# Patient Record
Sex: Female | Born: 1985 | Hispanic: No | Marital: Married | Smoking: Never smoker
Health system: Southern US, Community
[De-identification: ages and names within clinical notes are randomized; demographics above are authoritative.]

## PROBLEM LIST (undated history)

## (undated) ENCOUNTER — Inpatient Hospital Stay (HOSPITAL_COMMUNITY): Payer: Self-pay

## (undated) DIAGNOSIS — G971 Other reaction to spinal and lumbar puncture: Secondary | ICD-10-CM

## (undated) DIAGNOSIS — O24419 Gestational diabetes mellitus in pregnancy, unspecified control: Secondary | ICD-10-CM

## (undated) HISTORY — PX: WISDOM TOOTH EXTRACTION: SHX21

## (undated) HISTORY — DX: Other reaction to spinal and lumbar puncture: G97.1

## (undated) HISTORY — PX: CERVICAL CERCLAGE: SHX1329

## (undated) HISTORY — DX: Gestational diabetes mellitus in pregnancy, unspecified control: O24.419

---

## 2014-05-12 LAB — OB RESULTS CONSOLE GC/CHLAMYDIA
Chlamydia: NEGATIVE
Gonorrhea: NEGATIVE

## 2014-05-12 LAB — OB RESULTS CONSOLE RPR: RPR: NONREACTIVE

## 2014-05-12 LAB — OB RESULTS CONSOLE HIV ANTIBODY (ROUTINE TESTING): HIV: NONREACTIVE

## 2014-05-12 LAB — OB RESULTS CONSOLE HEPATITIS B SURFACE ANTIGEN: Hepatitis B Surface Ag: NEGATIVE

## 2014-05-20 ENCOUNTER — Encounter: Payer: PPO | Attending: Obstetrics

## 2014-05-20 DIAGNOSIS — O24419 Gestational diabetes mellitus in pregnancy, unspecified control: Secondary | ICD-10-CM | POA: Insufficient documentation

## 2014-05-20 DIAGNOSIS — Z713 Dietary counseling and surveillance: Secondary | ICD-10-CM | POA: Insufficient documentation

## 2014-05-21 ENCOUNTER — Ambulatory Visit: Payer: Self-pay | Admitting: *Deleted

## 2014-05-21 ENCOUNTER — Encounter: Payer: Self-pay | Admitting: *Deleted

## 2014-05-21 ENCOUNTER — Encounter: Payer: PPO | Admitting: *Deleted

## 2014-05-21 VITALS — Ht 60.0 in | Wt 150.4 lb

## 2014-05-21 DIAGNOSIS — O24419 Gestational diabetes mellitus in pregnancy, unspecified control: Secondary | ICD-10-CM

## 2014-05-21 DIAGNOSIS — Z713 Dietary counseling and surveillance: Secondary | ICD-10-CM | POA: Diagnosis not present

## 2014-05-24 ENCOUNTER — Encounter: Payer: Self-pay | Admitting: *Deleted

## 2014-05-24 NOTE — Progress Notes (Signed)
  Patient was seen on 05/21/14 for Gestational Diabetes self-management . The following learning objectives were met by the patient :   States the definition of Gestational Diabetes  States why dietary management is important in controlling blood glucose  Describes the effects of carbohydrates on blood glucose levels  Demonstrates ability to create a balanced meal plan  Demonstrates carbohydrate counting   States when to check blood glucose levels  Demonstrates proper blood glucose monitoring techniques  States the effect of stress and exercise on blood glucose levels  States the importance of limiting caffeine and abstaining from alcohol and smoking  Plan:  Aim for 2 Carb Choices per meal (30 grams) +/- 1 either way for breakfast Aim for 3 Carb Choices per meal (45 grams) +/- 1 either way from lunch and dinner Aim for 1-2 Carbs per snack Begin reading food labels for Total Carbohydrate and sugar grams of foods Consider  increasing your activity level by walking daily as tolerated Begin checking BG before breakfast and 2 hours after first bit of breakfast, lunch and dinner after  as directed by MD  Take medication  as directed by MD  Blood glucose monitor given: Accu Chek Nano BG Monitoring Kit Lot # [id number] Exp: 04/07/15  Patient instructed to monitor glucose levels: FBS: 60 - <90 2 hour: <120  Patient received the following handouts:  Nutrition Diabetes and Pregnancy  Carbohydrate Counting List  Meal Planning worksheet  Patient will be seen for follow-up as needed.

## 2014-06-05 LAB — OB RESULTS CONSOLE GBS: STREP GROUP B AG: NEGATIVE

## 2014-06-28 ENCOUNTER — Encounter (HOSPITAL_COMMUNITY): Payer: Self-pay

## 2014-06-28 ENCOUNTER — Inpatient Hospital Stay (HOSPITAL_COMMUNITY)
Admission: AD | Admit: 2014-06-28 | Discharge: 2014-07-02 | DRG: 765 | Disposition: A | Discharge status: Home / Self Care | Payer: PPO | Source: Ambulatory Visit | Attending: Obstetrics & Gynecology | Admitting: Obstetrics & Gynecology

## 2014-06-28 DIAGNOSIS — Z3A40 40 weeks gestation of pregnancy: Secondary | ICD-10-CM | POA: Diagnosis present

## 2014-06-28 DIAGNOSIS — O4593 Premature separation of placenta, unspecified, third trimester: Secondary | ICD-10-CM | POA: Diagnosis present

## 2014-06-28 DIAGNOSIS — O480 Post-term pregnancy: Secondary | ICD-10-CM | POA: Diagnosis present

## 2014-06-28 DIAGNOSIS — O24429 Gestational diabetes mellitus in childbirth, unspecified control: Secondary | ICD-10-CM | POA: Diagnosis present

## 2014-06-28 DIAGNOSIS — O99824 Streptococcus B carrier state complicating childbirth: Secondary | ICD-10-CM | POA: Diagnosis present

## 2014-06-28 DIAGNOSIS — O4590 Premature separation of placenta, unspecified, unspecified trimester: Secondary | ICD-10-CM | POA: Diagnosis not present

## 2014-06-28 LAB — TYPE AND SCREEN
ABO/RH(D): O POS
Antibody Screen: NEGATIVE

## 2014-06-28 LAB — CBC
HCT: 37.1 % (ref 36.0–46.0)
Hemoglobin: 12.2 g/dL (ref 12.0–15.0)
MCH: 27.4 pg (ref 26.0–34.0)
MCHC: 32.9 g/dL (ref 30.0–36.0)
MCV: 83.2 fL (ref 78.0–100.0)
Platelets: 244 10*3/uL (ref 150–400)
RBC: 4.46 MIL/uL (ref 3.87–5.11)
RDW: 16.5 % — ABNORMAL HIGH (ref 11.5–15.5)
WBC: 6.9 10*3/uL (ref 4.0–10.5)

## 2014-06-28 MED ORDER — CITRIC ACID-SODIUM CITRATE 334-500 MG/5ML PO SOLN
30.0000 mL | ORAL | Status: DC | PRN
  Administered 2014-06-29: 30 mL via ORAL
  Filled 2014-06-28 (×2): qty 15

## 2014-06-28 MED ORDER — LACTATED RINGERS IV SOLN
500.0000 mL | INTRAVENOUS | Status: DC | PRN
  Administered 2014-06-29: 500 mL via INTRAVENOUS

## 2014-06-28 MED ORDER — FLEET ENEMA 7-19 GM/118ML RE ENEM: 1.0000 | ENEMA | RECTAL | Status: DC | PRN

## 2014-06-28 MED ORDER — OXYCODONE-ACETAMINOPHEN 5-325 MG PO TABS: 2.0000 | ORAL_TABLET | ORAL | Status: DC | PRN

## 2014-06-28 MED ORDER — OXYTOCIN 40 UNITS IN LACTATED RINGERS INFUSION - SIMPLE MED: 62.5000 mL/h | INTRAVENOUS | Status: DC

## 2014-06-28 MED ORDER — OXYTOCIN BOLUS FROM INFUSION: 500.0000 mL | INTRAVENOUS | Status: DC

## 2014-06-28 MED ORDER — LACTATED RINGERS IV SOLN
INTRAVENOUS | Status: DC
  Administered 2014-06-28 – 2014-06-29 (×2): via INTRAVENOUS

## 2014-06-28 MED ORDER — ACETAMINOPHEN 325 MG PO TABS: 650.0000 mg | ORAL_TABLET | ORAL | Status: DC | PRN

## 2014-06-28 MED ORDER — OXYCODONE-ACETAMINOPHEN 5-325 MG PO TABS: 1.0000 | ORAL_TABLET | ORAL | Status: DC | PRN

## 2014-06-28 MED ORDER — ONDANSETRON HCL 4 MG/2ML IJ SOLN: 4.0000 mg | Freq: Four times a day (QID) | INTRAMUSCULAR | Status: DC | PRN

## 2014-06-28 MED ORDER — LIDOCAINE HCL (PF) 1 % IJ SOLN: 30.0000 mL | INTRAMUSCULAR | Status: DC | PRN

## 2014-06-28 NOTE — MAU Note (Signed)
Pt states that she started to leak clear fluid at 1800. Pt states that baby is active.

## 2014-06-28 NOTE — MAU Note (Signed)
Pt to be admitted with labor orders as per protocol.

## 2014-06-28 NOTE — MAU Note (Signed)
PT presents complaining of a gush of clear fluid at 1800 with some bloody show. Feels like it's still leaking but not wearing pad. States she is also having contractions. Reports good fetal movement.

## 2014-06-28 NOTE — MAU Note (Signed)
Report called to [facility] RN on BS. Will go to 170

## 2014-06-29 ENCOUNTER — Encounter (HOSPITAL_COMMUNITY): Payer: Self-pay | Admitting: *Deleted

## 2014-06-29 ENCOUNTER — Inpatient Hospital Stay (HOSPITAL_COMMUNITY): Payer: PPO | Admitting: Anesthesiology

## 2014-06-29 ENCOUNTER — Encounter (HOSPITAL_COMMUNITY)
Admission: AD | Disposition: A | Discharge status: Home / Self Care | Payer: Self-pay | Source: Ambulatory Visit | Attending: Obstetrics & Gynecology

## 2014-06-29 DIAGNOSIS — O4590 Premature separation of placenta, unspecified, unspecified trimester: Secondary | ICD-10-CM | POA: Diagnosis not present

## 2014-06-29 LAB — CBC
HEMATOCRIT: 31.8 % — AB (ref 36.0–46.0)
Hemoglobin: 10.4 g/dL — ABNORMAL LOW (ref 12.0–15.0)
MCH: 27.7 pg (ref 26.0–34.0)
MCHC: 32.7 g/dL (ref 30.0–36.0)
MCV: 84.8 fL (ref 78.0–100.0)
Platelets: 206 10*3/uL (ref 150–400)
RBC: 3.75 MIL/uL — AB (ref 3.87–5.11)
RDW: 16.6 % — ABNORMAL HIGH (ref 11.5–15.5)
WBC: 15.9 10*3/uL — ABNORMAL HIGH (ref 4.0–10.5)

## 2014-06-29 LAB — SAVE SMEAR

## 2014-06-29 LAB — GLUCOSE, CAPILLARY
GLUCOSE-CAPILLARY: 110 mg/dL — AB (ref 70–99)
GLUCOSE-CAPILLARY: 92 mg/dL (ref 70–99)
Glucose-Capillary: 85 mg/dL (ref 70–99)

## 2014-06-29 LAB — FIBRINOGEN
FIBRINOGEN: 548 mg/dL — AB (ref 204–475)
Fibrinogen: 468 mg/dL (ref 204–475)

## 2014-06-29 LAB — PROTIME-INR
INR: 0.96 (ref 0.00–1.49)
Prothrombin Time: 12.9 seconds (ref 11.6–15.2)

## 2014-06-29 LAB — APTT: APTT: 27 s (ref 24–37)

## 2014-06-29 LAB — ABO/RH: ABO/RH(D): O POS

## 2014-06-29 LAB — RPR: RPR Ser Ql: NONREACTIVE

## 2014-06-29 LAB — PLATELET COUNT: PLATELETS: 236 10*3/uL (ref 150–400)

## 2014-06-29 SURGERY — Surgical Case
Anesthesia: Epidural

## 2014-06-29 MED ORDER — LACTATED RINGERS IV SOLN
INTRAVENOUS | Status: DC | PRN
  Administered 2014-06-29: 11:00:00 via INTRAVENOUS

## 2014-06-29 MED ORDER — OXYTOCIN 40 UNITS IN LACTATED RINGERS INFUSION - SIMPLE MED: 62.5000 mL/h | INTRAVENOUS | Status: AC

## 2014-06-29 MED ORDER — MISOPROSTOL 25 MCG QUARTER TABLET: 25.0000 ug | ORAL_TABLET | ORAL | Status: DC | PRN

## 2014-06-29 MED ORDER — DIPHENHYDRAMINE HCL 25 MG PO CAPS
25.0000 mg | ORAL_CAPSULE | ORAL | Status: DC | PRN
  Administered 2014-06-30: 25 mg via ORAL
  Filled 2014-06-29: qty 1

## 2014-06-29 MED ORDER — FENTANYL 2.5 MCG/ML BUPIVACAINE 1/10 % EPIDURAL INFUSION (WH - ANES)
INTRAMUSCULAR | Status: DC | PRN
  Administered 2014-06-29: 14 mL/h via EPIDURAL

## 2014-06-29 MED ORDER — NALBUPHINE HCL 10 MG/ML IJ SOLN
5.0000 mg | INTRAMUSCULAR | Status: DC | PRN
  Administered 2014-06-29: 5 mg via INTRAVENOUS

## 2014-06-29 MED ORDER — SCOPOLAMINE 1 MG/3DAYS TD PT72
1.0000 | MEDICATED_PATCH | Freq: Once | TRANSDERMAL | Status: AC
  Administered 2014-06-29: 1.5 mg via TRANSDERMAL
  Filled 2014-06-29: qty 1

## 2014-06-29 MED ORDER — EPHEDRINE 5 MG/ML INJ
10.0000 mg | INTRAVENOUS | Status: DC | PRN
Start: 2014-06-29 — End: 2014-06-29

## 2014-06-29 MED ORDER — TETANUS-DIPHTH-ACELL PERTUSSIS 5-2.5-18.5 LF-MCG/0.5 IM SUSP: 0.5000 mL | Freq: Once | INTRAMUSCULAR | Status: DC

## 2014-06-29 MED ORDER — NALBUPHINE HCL 10 MG/ML IJ SOLN
5.0000 mg | Freq: Once | INTRAMUSCULAR | Status: AC | PRN
  Administered 2014-06-29: 5 mg via SUBCUTANEOUS

## 2014-06-29 MED ORDER — BUTORPHANOL TARTRATE 1 MG/ML IJ SOLN: 1.0000 mg | INTRAMUSCULAR | Status: DC | PRN

## 2014-06-29 MED ORDER — ZOLPIDEM TARTRATE 5 MG PO TABS: 5.0000 mg | ORAL_TABLET | Freq: Every evening | ORAL | Status: DC | PRN

## 2014-06-29 MED ORDER — SIMETHICONE 80 MG PO CHEW: 80.0000 mg | CHEWABLE_TABLET | ORAL | Status: DC | PRN

## 2014-06-29 MED ORDER — DIPHENHYDRAMINE HCL 25 MG PO CAPS
25.0000 mg | ORAL_CAPSULE | Freq: Four times a day (QID) | ORAL | Status: DC | PRN
  Filled 2014-06-29: qty 1

## 2014-06-29 MED ORDER — ONDANSETRON HCL 4 MG/2ML IJ SOLN
INTRAMUSCULAR | Status: DC | PRN
  Administered 2014-06-29: 4 mg via INTRAVENOUS

## 2014-06-29 MED ORDER — SIMETHICONE 80 MG PO CHEW
80.0000 mg | CHEWABLE_TABLET | Freq: Three times a day (TID) | ORAL | Status: DC
  Administered 2014-06-29 – 2014-07-02 (×8): 80 mg via ORAL
  Filled 2014-06-29 (×7): qty 1

## 2014-06-29 MED ORDER — NALBUPHINE HCL 10 MG/ML IJ SOLN
5.0000 mg | INTRAMUSCULAR | Status: DC | PRN
  Filled 2014-06-29: qty 1

## 2014-06-29 MED ORDER — WITCH HAZEL-GLYCERIN EX PADS: 1.0000 "application " | MEDICATED_PAD | CUTANEOUS | Status: DC | PRN

## 2014-06-29 MED ORDER — SENNOSIDES-DOCUSATE SODIUM 8.6-50 MG PO TABS
2.0000 | ORAL_TABLET | ORAL | Status: DC
  Administered 2014-06-29 – 2014-07-02 (×3): 2 via ORAL
  Filled 2014-06-29 (×3): qty 2

## 2014-06-29 MED ORDER — DIBUCAINE 1 % RE OINT: 1.0000 "application " | TOPICAL_OINTMENT | RECTAL | Status: DC | PRN

## 2014-06-29 MED ORDER — FENTANYL CITRATE 0.05 MG/ML IJ SOLN: 25.0000 ug | INTRAMUSCULAR | Status: DC | PRN

## 2014-06-29 MED ORDER — PHENYLEPHRINE HCL 10 MG/ML IJ SOLN
INTRAMUSCULAR | Status: DC | PRN
  Administered 2014-06-29 (×2): 80 ug via INTRAVENOUS

## 2014-06-29 MED ORDER — SIMETHICONE 80 MG PO CHEW
80.0000 mg | CHEWABLE_TABLET | ORAL | Status: DC
  Administered 2014-06-29 – 2014-07-02 (×3): 80 mg via ORAL
  Filled 2014-06-29 (×3): qty 1

## 2014-06-29 MED ORDER — ONDANSETRON HCL 4 MG/2ML IJ SOLN: 4.0000 mg | Freq: Three times a day (TID) | INTRAMUSCULAR | Status: DC | PRN

## 2014-06-29 MED ORDER — NALOXONE HCL 0.4 MG/ML IJ SOLN: 0.4000 mg | INTRAMUSCULAR | Status: DC | PRN

## 2014-06-29 MED ORDER — NALOXONE HCL 1 MG/ML IJ SOLN
1.0000 ug/kg/h | INTRAVENOUS | Status: DC | PRN
  Filled 2014-06-29: qty 2

## 2014-06-29 MED ORDER — PHENYLEPHRINE 40 MCG/ML (10ML) SYRINGE FOR IV PUSH (FOR BLOOD PRESSURE SUPPORT)
80.0000 ug | PREFILLED_SYRINGE | INTRAVENOUS | Status: DC | PRN
Start: 2014-06-29 — End: 2014-06-29
  Administered 2014-06-29 (×2): 80 ug via INTRAVENOUS

## 2014-06-29 MED ORDER — LACTATED RINGERS IV SOLN: INTRAVENOUS | Status: DC

## 2014-06-29 MED ORDER — OXYTOCIN 10 UNIT/ML IJ SOLN
40.0000 [IU] | INTRAMUSCULAR | Status: DC | PRN
  Administered 2014-06-29: 40 [IU] via INTRAVENOUS

## 2014-06-29 MED ORDER — OXYCODONE-ACETAMINOPHEN 5-325 MG PO TABS
1.0000 | ORAL_TABLET | ORAL | Status: DC | PRN
  Administered 2014-06-30 – 2014-07-02 (×2): 1 via ORAL
  Filled 2014-06-29 (×2): qty 1

## 2014-06-29 MED ORDER — ONDANSETRON HCL 4 MG/2ML IJ SOLN
INTRAMUSCULAR | Status: AC
  Filled 2014-06-29: qty 2

## 2014-06-29 MED ORDER — OXYTOCIN 40 UNITS IN LACTATED RINGERS INFUSION - SIMPLE MED
1.0000 m[IU]/min | INTRAVENOUS | Status: DC
  Administered 2014-06-29 (×2): 2 m[IU]/min via INTRAVENOUS
  Filled 2014-06-29: qty 1000

## 2014-06-29 MED ORDER — TERBUTALINE SULFATE 1 MG/ML IJ SOLN
0.2500 mg | Freq: Once | INTRAMUSCULAR | Status: AC | PRN
  Administered 2014-06-29: 10:00:00 via SUBCUTANEOUS

## 2014-06-29 MED ORDER — LIDOCAINE-EPINEPHRINE (PF) 2 %-1:200000 IJ SOLN
INTRAMUSCULAR | Status: DC | PRN
  Administered 2014-06-29: 5 mL via EPIDURAL
  Administered 2014-06-29: 2 mL via EPIDURAL
  Administered 2014-06-29: 5 mL via EPIDURAL

## 2014-06-29 MED ORDER — LANOLIN HYDROUS EX OINT: 1.0000 "application " | TOPICAL_OINTMENT | CUTANEOUS | Status: DC | PRN

## 2014-06-29 MED ORDER — ONDANSETRON HCL 4 MG/2ML IJ SOLN: 4.0000 mg | Freq: Once | INTRAMUSCULAR | Status: DC | PRN

## 2014-06-29 MED ORDER — CEFAZOLIN SODIUM-DEXTROSE 2-3 GM-% IV SOLR
INTRAVENOUS | Status: DC | PRN
  Administered 2014-06-29: 2 g via INTRAVENOUS

## 2014-06-29 MED ORDER — ACETAMINOPHEN 500 MG PO TABS
1000.0000 mg | ORAL_TABLET | Freq: Four times a day (QID) | ORAL | Status: AC
  Administered 2014-06-29 – 2014-06-30 (×3): 1000 mg via ORAL
  Filled 2014-06-29 (×3): qty 2

## 2014-06-29 MED ORDER — DIPHENHYDRAMINE HCL 50 MG/ML IJ SOLN: 12.5000 mg | INTRAMUSCULAR | Status: DC | PRN

## 2014-06-29 MED ORDER — TERBUTALINE SULFATE 1 MG/ML IJ SOLN
INTRAMUSCULAR | Status: AC
  Filled 2014-06-29: qty 1

## 2014-06-29 MED ORDER — MORPHINE SULFATE (PF) 0.5 MG/ML IJ SOLN
INTRAMUSCULAR | Status: DC | PRN
  Administered 2014-06-29: 1 mg via INTRAVENOUS
  Administered 2014-06-29: 4 mg via EPIDURAL

## 2014-06-29 MED ORDER — PRENATAL MULTIVITAMIN CH
1.0000 | ORAL_TABLET | Freq: Every day | ORAL | Status: DC
  Administered 2014-06-29 – 2014-07-01 (×3): 1 via ORAL
  Filled 2014-06-29 (×3): qty 1

## 2014-06-29 MED ORDER — DIPHENHYDRAMINE HCL 50 MG/ML IJ SOLN
12.5000 mg | INTRAMUSCULAR | Status: DC | PRN
Start: 2014-06-29 — End: 2014-07-02
  Administered 2014-06-29: 12.5 mg via INTRAVENOUS
  Filled 2014-06-29: qty 1

## 2014-06-29 MED ORDER — IBUPROFEN 600 MG PO TABS
600.0000 mg | ORAL_TABLET | Freq: Four times a day (QID) | ORAL | Status: DC
  Administered 2014-06-29 – 2014-07-02 (×11): 600 mg via ORAL
  Filled 2014-06-29 (×11): qty 1

## 2014-06-29 MED ORDER — SODIUM CHLORIDE 0.9 % IJ SOLN: 3.0000 mL | INTRAMUSCULAR | Status: DC | PRN

## 2014-06-29 MED ORDER — LACTATED RINGERS IV SOLN
500.0000 mL | Freq: Once | INTRAVENOUS | Status: AC
  Administered 2014-06-29: 500 mL via INTRAVENOUS

## 2014-06-29 MED ORDER — OXYCODONE-ACETAMINOPHEN 5-325 MG PO TABS
2.0000 | ORAL_TABLET | ORAL | Status: DC | PRN
Start: 2014-06-29 — End: 2014-07-02

## 2014-06-29 MED ORDER — NALBUPHINE HCL 10 MG/ML IJ SOLN
INTRAMUSCULAR | Status: AC
  Filled 2014-06-29: qty 1

## 2014-06-29 MED ORDER — NALBUPHINE HCL 10 MG/ML IJ SOLN: 5.0000 mg | Freq: Once | INTRAMUSCULAR | Status: AC | PRN

## 2014-06-29 MED ORDER — OXYTOCIN 10 UNIT/ML IJ SOLN
INTRAMUSCULAR | Status: AC
  Filled 2014-06-29: qty 4

## 2014-06-29 MED ORDER — PHENYLEPHRINE 40 MCG/ML (10ML) SYRINGE FOR IV PUSH (FOR BLOOD PRESSURE SUPPORT)
80.0000 ug | PREFILLED_SYRINGE | INTRAVENOUS | Status: DC | PRN
  Filled 2014-06-29: qty 20

## 2014-06-29 MED ORDER — OXYTOCIN 40 UNITS IN LACTATED RINGERS INFUSION - SIMPLE MED: 1.0000 m[IU]/min | INTRAVENOUS | Status: DC

## 2014-06-29 MED ORDER — MORPHINE SULFATE 0.5 MG/ML IJ SOLN
INTRAMUSCULAR | Status: AC
  Filled 2014-06-29: qty 10

## 2014-06-29 MED ORDER — EPHEDRINE 5 MG/ML INJ: 10.0000 mg | INTRAVENOUS | Status: DC | PRN

## 2014-06-29 MED ORDER — ONDANSETRON HCL 4 MG PO TABS: 4.0000 mg | ORAL_TABLET | ORAL | Status: DC | PRN

## 2014-06-29 MED ORDER — PHENYLEPHRINE 40 MCG/ML (10ML) SYRINGE FOR IV PUSH (FOR BLOOD PRESSURE SUPPORT)
PREFILLED_SYRINGE | INTRAVENOUS | Status: AC
  Filled 2014-06-29: qty 10

## 2014-06-29 MED ORDER — LIDOCAINE HCL (PF) 1 % IJ SOLN
INTRAMUSCULAR | Status: DC | PRN
  Administered 2014-06-29: 3 mL
  Administered 2014-06-29 (×2): 5 mL

## 2014-06-29 MED ORDER — MENTHOL 3 MG MT LOZG: 1.0000 | LOZENGE | OROMUCOSAL | Status: DC | PRN

## 2014-06-29 MED ORDER — MEPERIDINE HCL 25 MG/ML IJ SOLN: 6.2500 mg | INTRAMUSCULAR | Status: DC | PRN

## 2014-06-29 MED ORDER — ONDANSETRON HCL 4 MG/2ML IJ SOLN: 4.0000 mg | INTRAMUSCULAR | Status: DC | PRN

## 2014-06-29 MED ORDER — FENTANYL 2.5 MCG/ML BUPIVACAINE 1/10 % EPIDURAL INFUSION (WH - ANES)
14.0000 mL/h | INTRAMUSCULAR | Status: DC | PRN
  Administered 2014-06-29: 14 mL/h via EPIDURAL
  Filled 2014-06-29 (×2): qty 125

## 2014-06-29 SURGICAL SUPPLY — 42 items
BARRIER ADHS 3X4 INTERCEED (GAUZE/BANDAGES/DRESSINGS) ×3 IMPLANT
BENZOIN TINCTURE PRP APPL 2/3 (GAUZE/BANDAGES/DRESSINGS) IMPLANT
CLAMP CORD UMBIL (MISCELLANEOUS) IMPLANT
CLOSURE WOUND 1/2 X4 (GAUZE/BANDAGES/DRESSINGS)
CLOTH BEACON ORANGE TIMEOUT ST (SAFETY) ×3 IMPLANT
CONTAINER PREFILL 10% NBF 15ML (MISCELLANEOUS) IMPLANT
DRAPE SHEET LG 3/4 BI-LAMINATE (DRAPES) IMPLANT
DRSG OPSITE POSTOP 4X10 (GAUZE/BANDAGES/DRESSINGS) ×3 IMPLANT
DURAPREP 26ML APPLICATOR (WOUND CARE) ×3 IMPLANT
ELECT REM PT RETURN 9FT ADLT (ELECTROSURGICAL) ×3
ELECTRODE REM PT RTRN 9FT ADLT (ELECTROSURGICAL) ×1 IMPLANT
EXTRACTOR VACUUM M CUP 4 TUBE (SUCTIONS) IMPLANT
EXTRACTOR VACUUM M CUP 4' TUBE (SUCTIONS)
GLOVE BIOGEL PI IND STRL 7.0 (GLOVE) ×1 IMPLANT
GLOVE BIOGEL PI INDICATOR 7.0 (GLOVE) ×2
GLOVE ECLIPSE 6.5 STRL STRAW (GLOVE) ×3 IMPLANT
GOWN STRL REUS W/TWL LRG LVL3 (GOWN DISPOSABLE) ×6 IMPLANT
KIT ABG SYR 3ML LUER SLIP (SYRINGE) IMPLANT
NEEDLE HYPO 25X1 1.5 SAFETY (NEEDLE) ×3 IMPLANT
NEEDLE HYPO 25X5/8 SAFETYGLIDE (NEEDLE) IMPLANT
NS IRRIG 1000ML POUR BTL (IV SOLUTION) ×3 IMPLANT
PACK C SECTION WH (CUSTOM PROCEDURE TRAY) ×3 IMPLANT
PAD ABD 8X7 1/2 STERILE (GAUZE/BANDAGES/DRESSINGS) ×3 IMPLANT
PAD OB MATERNITY 4.3X12.25 (PERSONAL CARE ITEMS) ×3 IMPLANT
RTRCTR C-SECT PINK 25CM LRG (MISCELLANEOUS) IMPLANT
SPONGE GAUZE 4X4 12PLY STER LF (GAUZE/BANDAGES/DRESSINGS) ×3 IMPLANT
STAPLER VISISTAT 35W (STAPLE) IMPLANT
STRIP CLOSURE SKIN 1/2X4 (GAUZE/BANDAGES/DRESSINGS) IMPLANT
SUT CHROMIC GUT AB #0 18 (SUTURE) IMPLANT
SUT MNCRL 0 VIOLET CTX 36 (SUTURE) ×3 IMPLANT
SUT MON AB 4-0 PS1 27 (SUTURE) IMPLANT
SUT MONOCRYL 0 CTX 36 (SUTURE) ×6
SUT PLAIN 2 0 (SUTURE)
SUT PLAIN 2 0 XLH (SUTURE) IMPLANT
SUT PLAIN ABS 2-0 CT1 27XMFL (SUTURE) IMPLANT
SUT VIC AB 0 CT1 36 (SUTURE) ×6 IMPLANT
SUT VIC AB 2-0 CT1 27 (SUTURE) ×2
SUT VIC AB 2-0 CT1 TAPERPNT 27 (SUTURE) ×1 IMPLANT
SUT VIC AB 4-0 PS2 27 (SUTURE) IMPLANT
SYR CONTROL 10ML LL (SYRINGE) ×3 IMPLANT
TOWEL OR 17X24 6PK STRL BLUE (TOWEL DISPOSABLE) ×3 IMPLANT
TRAY FOLEY CATH 14FR (SET/KITS/TRAYS/PACK) IMPLANT

## 2014-06-29 NOTE — Progress Notes (Signed)
Pt with tangerine sized blood clot on towel. Lab in room to draw DIC panel ordered by Dr Cherly Hensenousins. Abdomen soft, non tender

## 2014-06-29 NOTE — Anesthesia Postprocedure Evaluation (Signed)
  Anesthesia Post-op Note  Patient: Megan Camacho  Procedure(s) Performed: Procedure(s) (LRB): CESAREAN SECTION (N/A)  Patient Location: PACU  Anesthesia Type: Regional  Level of Consciousness: awake and alert   Airway and Oxygen Therapy: Patient Spontanous Breathing  Post-op Pain: mild  Post-op Assessment: Post-op Vital signs reviewed, Patient's Cardiovascular Status Stable, Respiratory Function Stable, Patent Airway and No signs of Nausea or vomiting  Last Vitals:  Filed Vitals:   06/29/14 1215  BP: 108/57  Pulse: 98  Temp:   Resp: 23    Post-op Vital Signs: stable   Complications: No apparent anesthesia complications

## 2014-06-29 NOTE — Progress Notes (Signed)
Interval Note:  Called to bedside for prolonged deceleration. IU resuscitation in place-Pitocin off, hand & knees position, IVF bolus, and o2 by NRB. SVE: 6/80/-2  FHT: 80-90s, mod variability  A: fetal bradycardia-not responsive to intrauterine resuscitation/remote from delivery  P: Terbultaline x1, changed position into lateral, Dr. Juliene PinaMody and Dr. Debroah LoopArnold notified of need for stat CS. Risks of procedure -bleeding, infection, damage to surrounding organs, and need for blood transfusion discussed-pt accepts risks. Dr. Juliene PinaMody in room, FHR now 120-130s w/ variables, SVE-unchanged, large amt drk blood noted- to OR for CS

## 2014-06-29 NOTE — Op Note (Signed)
Cesarean Section Procedure Note  Megan Camacho  06/29/2014  Indications:Patient admitted at 40.1 weeks in labor, 6 cm dilated, fetal bradycardia noted for 13 minutes but recovered, variability was moderate but patient started to have heavier vaginal bleeding and a large placenta abruption was suspected. So she was brought into the OR for emergency c/section.   Pre-operative Diagnosis: Vaginal bleeding, abruptio placenta in labor, remote from delivery   Post-operative Diagnosis: Same   Surgeon: V.Hamda Klutts, MD  Assistants: Donette LarryMelanie Bhambri, CNM  Anesthesia: Epidural, Dr Gentry RochJudd   Procedure Details:  The patient was seen in Labor Room for urgent evalulation of fetal bradycardia. FHR was back up to normal but pelvic exam done to replace FSE noted heavier bleeding and considering remote from delivery, Emergency C/section was recommended and patient agreed.  The risks, benefits, complications, treatment options, and expected outcomes were discussed with the patient. The patient concurred with the proposed plan, giving informed consent. identified as Megan Camacho and the procedure verified as C-Section Delivery. A Time Out was held and the above information confirmed. 2 gm Ancef given. Intra-op FHT was not recording per RN, so decision was made to proceed with betadine splash on the abdome. Epidural anesthesia was adequate. Patient was draped in usual sterile manner. A Pfannenstiel incision was made and carried down through the subcutaneous tissue to the fascia. Fascial incision was made and extended transversely. The fascia was separated from the underlying rectus tissue superiorly and inferiorly. The peritoneum was identified and entered. Peritoneal incision was extended longitudinally.  A low transverse uterine incision was made. Large clots came out after which vertex was reached and baby was delivered Cephalic with fundal pressure assistance. True know in the cord and a cord around the foot was noted. Cord was  clamped and cut and active vigorous FEMALE infant was handed to NICU team in attendance. Birth time 10.56 am on 06/29/14. Apgar scores of 8 at one minute and 10 at five minutes. Cord ph was sent and returned normal. Umbilical cord  blood was obtained for evaluation. The placenta was easily removed since noted to be partially separated but intact and sent to pathology.  The uterine outline, tubes and ovaries appeared normal. There was no evidence of uterine atony or Couvelaire's uterus.  The uterine incision was closed with running locked sutures of 0Vicryl followed by a second layer imbricating the first with excellent hemostasis. Peritoneal gutters cleaned with lap of collected blood. Peritoneal closure done with 2-0 vicryl. The fascia was then reapproximated with running sutures of 0Vicryl.  The skin was closed with Staples. Sterile dressing placed.   Instrument, sponge, and needle counts were correct prior the abdominal closure and were correct at the conclusion of the case.   Findings: Placental abruption with large clots noted at [facility]. Baby boy delivered at 10.56 am on 06/29/14 cephalic with vigorous cry and tone, Apgars 8 and 10. Cord pH sent was normal. True knot in cord and cord around the foot noted at delivery.    Estimated Blood Loss: 700 cc  Total IV Fluids: LR 2300 cc   Urine Output: clear in foley  Specimens: Cord gas, cord blood and placenta   Complications: no complications  Disposition: PACU - hemodynamically stable.   Maternal Condition: stable   Baby condition / location:  Couplet care / Skin to Skin  Attending Attestation: I performed the procedure.   Signed: Shea Evans-Omarri Eich, MD

## 2014-06-29 NOTE — Addendum Note (Signed)
Addendum  created 06/29/14 1706 by Orlie Pollenebra R Kesa Birky, CRNA   Modules edited: Notes Section   Notes Section:  File: [id number]

## 2014-06-29 NOTE — Anesthesia Procedure Notes (Signed)
Epidural Patient location during procedure: OB  Staffing Anesthesiologist: Mattie Novosel Performed by: anesthesiologist   Preanesthetic Checklist Completed: patient identified, site marked, surgical consent, pre-op evaluation, timeout performed, IV checked, risks and benefits discussed and monitors and equipment checked  Epidural Patient position: sitting Prep: ChloraPrep Patient monitoring: heart rate, continuous pulse ox and blood pressure Approach: right paramedian Location: L3-L4 Injection technique: LOR saline  Needle:  Needle type: Tuohy  Needle gauge: 17 G Needle length: 9 cm and 9 Needle insertion depth: 4 cm Catheter type: closed end flexible Catheter size: 20 Guage Catheter at skin depth: 9 cm Test dose: negative  Assessment Events: blood not aspirated, injection not painful, no injection resistance, negative IV test and no paresthesia  Additional Notes   Patient tolerated the insertion well without complications.   

## 2014-06-29 NOTE — Progress Notes (Signed)
Called for new onset vaginal bleeding Pt had gone to bathroom  O: Blood pressure 109/69, pulse 77, temperature 98 F (36.7 C), temperature source Oral, resp. rate 18, height 5' (1.524 m), weight 68.312 kg (150 lb 9.6 oz). Abd: gravid soft nontender Pelvic; deferred Pad( towel) BRB  Tracing: baseline baseline 130 min variability (+) variables( subtle) Ctx q 2-3 mins  IMP: SROm with new onset third trimester vaginal bleeding ? Marginal abruption P) DIC panel. Cont with present management. Watch fetal tracing closely

## 2014-06-29 NOTE — Anesthesia Postprocedure Evaluation (Signed)
  Anesthesia Post-op Note  Patient: Megan Camacho  Procedure(s) Performed: Procedure(s): CESAREAN SECTION (N/A)  Patient Location: PACU and Mother/Baby  Anesthesia Type:Epidural  Level of Consciousness: awake, alert , oriented and patient cooperative  Airway and Oxygen Therapy: Patient Spontanous Breathing  Post-op Pain: none  Post-op Assessment: Post-op Vital signs reviewed, Patient's Cardiovascular Status Stable, Respiratory Function Stable, Patent Airway, No signs of Nausea or vomiting, Adequate PO intake, Pain level controlled, No headache, No backache, No residual numbness and No residual motor weakness  Post-op Vital Signs: Reviewed and stable  Last Vitals:  Filed Vitals:   06/29/14 1500  BP: 114/75  Pulse: 97  Temp: 37.2 C  Resp: 18    Complications: No apparent anesthesia complications

## 2014-06-29 NOTE — Progress Notes (Signed)
Megan Camacho is a 29 y.o. G2P0100 at 2655w1d by ultrasound admitted for rupture of membranes  Subjective: Chief Complaint  Patient presents with  . Rupture of Membranes    Objective: BP 104/57 mmHg  Pulse 77  Temp(Src) 98 F (36.7 C) (Oral)  Resp 18  Ht 5' (1.524 m)  Wt 68.312 kg (150 lb 9.6 oz)  BMI 29.41 kg/m2  SpO2 99%  Epidural  Comfortable pitocin   VE 4/100/-2 (+) scalp stimulation. ISE placed (+) vaginal bleeding Tracing reviewed: baseline 150  Min variability with (+) scalp stimulation Ctx q  1 1/2- 3 mins  Labs: Lab Results  Component Value Date   WBC 6.9 06/28/2014   HGB 12.2 06/28/2014   HCT 37.1 06/28/2014   MCV 83.2 06/28/2014   PLT 236 06/29/2014   DIC panel normal Assessment / Plan: Augmentation of labor, progressing well Placental abruption Term gestation SROM.. GBS cx neg P) pitocin off due to some variable decels ( small) with ctx. Will observe and resume pitocin after 1/2 hr. Pt and husband advised of covering CNMDonette Camacho( Megan Camacho) as was the nurse)  Anticipated MOD:  NSVD  Megan Camacho A 06/29/2014, 7:28 AM

## 2014-06-29 NOTE — H&P (Signed)
Megan Camacho Shively is a 29 y.o. female presenting @ 40 1/7 weeks with SROM clear fluid @ 6 pm. (+) irreg ctx GBS cx neg. PNC notable for left fetal pyelectasis on last sono. [place] in [place] where she was dx with GDM as well having had cerclage placement due to prior loss @ 23 weeks despite rescue cerclage Maternal Medical History:  Reason for admission: Rupture of membranes.   Contractions: Frequency: rare.    Fetal activity: Perceived fetal activity is normal.    Prenatal complications: Hx prior cervical incompetence s/p cerclage GDM diet controlled    OB History    Gravida Para Term Preterm AB TAB SAB Ectopic Multiple Living   2 1  1       0     Past Medical History  Diagnosis Date  . Gestational diabetes mellitus, antepartum   . Gestational diabetes    Past Surgical History  Procedure Laterality Date  . Cervical cerclage    . Wisdom tooth extraction     Family History: family history includes Diabetes in her father. Social History:  reports that she has never smoked. She does not have any smokeless tobacco history on file. She reports that she does not drink alcohol or use illicit drugs.   Prenatal Transfer Tool  Maternal Diabetes: Yes:  Diabetes Type:  Diet controlled Genetic Screening: Declined Maternal Ultrasounds/Referrals: Abnormal:  Findings:   Fetal renal pyelectasis Fetal Ultrasounds or other Referrals:  None Maternal Substance Abuse:  No Significant Maternal Medications:  None Significant Maternal Lab Results:  Lab values include: Group B Strep negative Other [facility]  [facility] [place]  Review of Systems  All other systems reviewed and are negative.   Dilation: 3 Effacement (%): 70 Station: -3 Exam by:: Kassy Mcenroe MD Blood pressure 102/66, pulse 86, temperature 97.8 F (36.6 C), temperature source Oral, resp. rate 18, height 5' (1.524 m), weight 68.312 kg (150 lb 9.6 oz). Exam Physical Exam  Constitutional: She is oriented to person, place, and  time. She appears well-developed and well-nourished.  HENT:  Head: Atraumatic.  Cardiovascular: Normal rate.   Respiratory: Breath sounds normal.  GI: Soft.  Neurological: She is alert and oriented to person, place, and time.  Skin: Skin is warm and dry.  Psychiatric: She has a normal mood and affect.   VE 3/70/-3 post Tracing: baseline 150 (+) accel 160 irreg ctx  Prenatal labs: ABO, Rh: --/--/O POS, O POS (02/21 2255) Antibody: NEG (02/21 2255) Rubella:   RPR: Nonreactive (01/05 0000)  HBsAg: Negative (01/05 0000)  HIV: Non-reactive (01/05 0000)  GBS: Negative (01/29 0000)   Assessment/Plan: SROM  GBS cx neg GDM Postterm Left fetal pyelectasis  P) admit routine labs. Pitocin augmentation. CBG q 4 hrs analgesic prn   Megan Camacho A 06/29/2014, 2:30 AM

## 2014-06-29 NOTE — Lactation Note (Signed)
This note was copied from the chart of Megan Camacho. Lactation Consultation Note Initial visit at 5 hours of age.  Parents report 2 good feedings already and baby is attempting latch now.  Baby is having a hard time holding breast in mouth and slips off the breast.  Mom has easily expressible colostrum that baby is licking from breast.  Instructed mom on how to sandwich breast and hold in football and cross cradle for latching.  Mom is recovering from c/s and has iv line and other monitors causing problems with allowing mom's hand to be more free.  Gave mom hand pump with demonstration and instructions to help evert nipples.  Nipples are small and short with firm areolas only semi compressible.  With instructions mom and FOB are talking over me in native language.  Both parents speak english well with appropriate questions.  When baby latched in cradle hold on right breast very large sucking dimples noted as lower lip was not open and flanged at the breast well.  Baby only sucked a few times when able to get a deep and wide latch. Baby remains STS with mom mouthing breast.  [facility] given and discussed.  Encouraged to feed with early cues on demand.  Early newborn behavior discussed.  Hand expression demonstrated with colostrum visible.  Spoon left in room for hand expression and spoon feeding PRN due to increased milk supply.    Mom to call for assist as needed.  [facility].    Patient Name: Megan Domingo PulseSahar Prusinski [id number]'s Date: 06/29/2014 Reason for consult: Initial assessment   Maternal Data Has patient been taught Hand Expression?: Yes Does the patient have breastfeeding experience prior to this delivery?: No  Feeding Feeding Type: Breast Fed Length of feed:  (Few sucks)  LATCH Score/Interventions Latch: Repeated attempts needed to sustain latch, nipple held in mouth throughout feeding, stimulation needed to elicit sucking reflex.  Audible Swallowing: A few with stimulation  (good flow of colostrum )  Type of Nipple: Everted at rest and after stimulation (short nipples)  Comfort (Breast/Nipple): Soft / non-tender     Hold (Positioning): Assistance needed to correctly position infant at breast and maintain latch. Intervention(s): Breastfeeding basics reviewed;Support Pillows;Position options;Skin to skin  LATCH Score: 7  Lactation Tools Discussed/Used Tools: Pump Breast pump type: Manual Initiated by:: JS Date initiated:: 06/29/14   Consult Status Consult Status: Follow-up Date: 06/30/14 Follow-up type: In-patient    Jannifer RodneyShoptaw, Trinetta Alemu Lynn 06/29/2014, 4:49 PM

## 2014-06-29 NOTE — Transfer of Care (Signed)
Immediate Anesthesia Transfer of Care Note  Patient: Megan Camacho  Procedure(s) Performed: Procedure(s): CESAREAN SECTION (N/A)  Patient Location: PACU  Anesthesia Type:Epidural  Level of Consciousness: awake, alert  and oriented  Airway & Oxygen Therapy: Patient Spontanous Breathing  Post-op Assessment: Report given to RN and Post -op Vital signs reviewed and stable  Post vital signs: Reviewed and stable  Last Vitals:  Filed Vitals:   06/29/14 1000  BP: 103/59  Pulse: 78  Temp:   Resp: 18    Complications: No apparent anesthesia complications

## 2014-06-29 NOTE — Progress Notes (Signed)
Subjective:   Comfortable with epidural. Left lateral. No c/o.  Objective:   VS: Blood pressure 99/61, pulse 74, temperature 98 F (36.7 C), temperature source Oral, resp. rate 18, height 5' (1.524 m), weight 68.312 kg (150 lb 9.6 oz), SpO2 99 %. FHR: baseline 150 / variability mod / accelerations 10x10 / no decelerations Toco: contractions every 3-5 minutes Cervix: Dilation: 5 Effacement (%): 100 Station: -1 Exam by:: Shakai Dolley, CNM Membranes: clear, bloody Pitocin: off  Assessment:  Labor: first stage, active FHR category I GBS positive Suspected marginal abruption  Plan:  Restart pitocin-decels resolved, tracing reassuring, monitor bleeding, anticipate labor progression.      Donette LarryBHAMBRI, Ingvald Theisen, N MSN, CNM 06/29/2014, 8:47 AM

## 2014-06-29 NOTE — Progress Notes (Signed)
Frank bleeding noted with one, tangerine-sized clot and dinner-plate-sized bleeding on pad and bed.  Patient reports on-going contraction pain, denies any new onset change in pain.  Provider notified.

## 2014-06-29 NOTE — Anesthesia Preprocedure Evaluation (Signed)
Anesthesia Evaluation  Patient identified by MRN, date of birth, ID band Patient awake    Reviewed: Allergy & Precautions, H&P , NPO status , Patient's Chart, lab work & pertinent test results  History of Anesthesia Complications Negative for: history of anesthetic complications  Airway Mallampati: II TM Distance: >3 FB Neck ROM: full    Dental no notable dental hx. (+) Teeth Intact   Pulmonary neg pulmonary ROS,  breath sounds clear to auscultation  Pulmonary exam normal       Cardiovascular negative cardio ROS  Rhythm:regular Rate:Normal     Neuro/Psych negative neurological ROS  negative psych ROS   GI/Hepatic negative GI ROS, Neg liver ROS,   Endo/Other  negative endocrine ROSdiabetes, Gestational  Renal/GU negative Renal ROS  negative genitourinary   Musculoskeletal   Abdominal Normal abdominal exam  (+)   Peds  Hematology negative hematology ROS (+)   Anesthesia Other Findings   Reproductive/Obstetrics (+) Pregnancy                           Anesthesia Physical Anesthesia Plan  ASA: II  Anesthesia Plan: Epidural   Post-op Pain Management:    Induction:   Airway Management Planned:   Additional Equipment:   Intra-op Plan:   Post-operative Plan:   Informed Consent: I have reviewed the patients History and Physical, chart, labs and discussed the procedure including the risks, benefits and alternatives for the proposed anesthesia with the patient or authorized representative who has indicated his/her understanding and acceptance.     Plan Discussed with:   Anesthesia Plan Comments:         Anesthesia Quick Evaluation  

## 2014-06-30 ENCOUNTER — Encounter (HOSPITAL_COMMUNITY): Payer: Self-pay | Admitting: Obstetrics & Gynecology

## 2014-06-30 LAB — CBC
HCT: 30.1 % — ABNORMAL LOW (ref 36.0–46.0)
Hemoglobin: 9.8 g/dL — ABNORMAL LOW (ref 12.0–15.0)
MCH: 27.6 pg (ref 26.0–34.0)
MCHC: 32.6 g/dL (ref 30.0–36.0)
MCV: 84.8 fL (ref 78.0–100.0)
PLATELETS: 191 10*3/uL (ref 150–400)
RBC: 3.55 MIL/uL — ABNORMAL LOW (ref 3.87–5.11)
RDW: 16.7 % — AB (ref 11.5–15.5)
WBC: 9.8 10*3/uL (ref 4.0–10.5)

## 2014-06-30 NOTE — Lactation Note (Signed)
This note was copied from the chart of Megan Elverta Wollschlager. Lactation Consultation Note: Asked by Dr Ezequiel EssexGable to assist mom with latch- reports he is showing feeding cues. Mom had to go to bathroom, when she was ready to latch- baby sound asleep- has just had circ. Mom reports he latches but only stays on a few minutes. Encouraged to watch for feeding cues and to call for assist when he wakes for feeding. Reviewed normal behavior after circ. Gave formula by bottle once this morning. Has DEBP in room- mom reports she has pumped once yesterday. Encouraged skin to skin as much as possible today. No questions at present. Patient Name: Megan Camacho PulseSahar Cederberg [id number]'s Date: 06/30/2014 Reason for consult: Follow-up assessment   Maternal Data Formula Feeding for Exclusion: No Does the patient have breastfeeding experience prior to this delivery?: No  Feeding    LATCH Score/Interventions                      Lactation Tools Discussed/Used     Consult Status Consult Status: Follow-up Date: 06/30/14 Follow-up type: In-patient    Pamelia HoitWeeks, Marlia Schewe D 06/30/2014, 11:47 AM

## 2014-06-30 NOTE — Lactation Note (Signed)
This note was copied from the chart of Megan Camacho. Lactation Consultation Note  Patient Name: Megan Domingo PulseSahar Laakso GLOVF'IToday's Date: 06/30/2014 Reason for consult: Follow-up assessment of this mom and baby at 33 hours pp.  LC unable to come for latch assistance but RN, Judeth CornfieldStephanie reports that mom needed latch help but baby finally able to latch well.  At time of visit, mom reports a 10 minute feeding and baby asleep in open crib on his back.  LC encouraged continued cue feedings and practice with positioning and latching baby.  Maternal Data    Feeding    LATCH Score/Interventions         Most recent LATCH score=5 (sleepy)  but earlier, baby's LATCH scores=7 per RN assessment             Lactation Tools Discussed/Used   Cue feedings  Consult Status Consult Status: Follow-up Date: 07/01/14 Follow-up type: In-patient    Warrick ParisianBryant, Franke Menter [facility] 06/30/2014, 8:42 PM

## 2014-06-30 NOTE — Progress Notes (Addendum)
Patient ID: Megan PulseSahar Camacho, female   DOB: 05-09-1985, 29 y.o.   MRN: [id number] Subjective: POD# 1 Information for the patient's newborn:  Megan Camacho, Boy [name] [[id number]][[id number]]  female  / circ planning  Reports feeling a little sore and tired, but well overall Feeding: breast Patient reports tolerating PO.  Breast symptoms: none Pain controlled with ibuprofen (OTC) and narcotic analgesics including Percocet Denies HA/SOB/C/P/N/V/dizziness. Flatus present. No BM. She reports vaginal bleeding as normal, without clots.  She is ambulating, urinating without difficult.     Objective:   VS:  Filed Vitals:   06/29/14 2000 06/29/14 2345 06/30/14 0430 06/30/14 0810  BP: 106/60 112/66 94/49 88/50   Camacho: 75 80 82 73  Temp: 98.6 F (37 C) 98.4 F (36.9 C) 98.2 F (36.8 C) 98.3 F (36.8 C)  TempSrc: Oral   Oral  Resp: 18 20 18 16   Height:      Weight:      SpO2: 96% 98% 97% 98%     Intake/Output Summary (Last 24 hours) at 06/30/14 1020 Last data filed at 06/29/14 2345  Gross per 24 hour  Intake 2718.13 ml  Output   3250 ml  Net -531.87 ml        Recent Labs  06/29/14 1205 06/30/14 0555  WBC 15.9* 9.8  HGB 10.4* 9.8*  HCT 31.8* 30.1*  PLT 206 191     Blood type: O POS (02/21 2255)  Rubella:   Immune    Physical Exam:  General: alert, cooperative and no distress CV: Regular rate and rhythm, S1S2 present or without murmur or extra heart sounds Resp: clear Abdomen: soft, nontender, normal bowel sounds Incision: Tegaderm and Honeycomb C/D/I - skin well approximated with staples / no edema, erythema, ecchymosis, or drainage Uterine Fundus: firm,1 FB  below umbilicus, nontender Lochia: minimal Ext: extremities normal, atraumatic, no cyanosis or edema, Homans sign is negative, no sign of DVT and no edema, redness or tenderness in the calves or thighs   Assessment/Plan: 29 y.o.   POD# 1.  s/p Cesarean Delivery.  Indications: fetal bradycardia and placental abruption                 Principal Problem:   Postpartum care following cesarean delivery (2/22) Active Problems:   Indication for care in labor or delivery   Placenta abruption, delivered, current hospitalization  Doing well, stable.               Regular diet as tolerated Ambulate Encouraged warm liquids to facilitate increased flatus / avoid straws Routine post-op care  Kenard GowerAWSON, Britton Perkinson, M, MSN, CNM 06/30/2014, 10:20 AM

## 2014-07-01 LAB — RUBELLA SCREEN: Rubella: 8.83 index (ref >0.99)

## 2014-07-01 MED ORDER — POLYSACCHARIDE IRON COMPLEX 150 MG PO CAPS
150.0000 mg | ORAL_CAPSULE | Freq: Every day | ORAL | Status: DC
  Administered 2014-07-01 – 2014-07-02 (×2): 150 mg via ORAL
  Filled 2014-07-01 (×2): qty 1

## 2014-07-01 MED ORDER — MAGNESIUM 200 MG PO TABS
200.0000 mg | ORAL_TABLET | Freq: Every day | ORAL | Status: DC
  Filled 2014-07-01 (×2): qty 1

## 2014-07-01 MED ORDER — MAGNESIUM OXIDE 400 (241.3 MG) MG PO TABS
200.0000 mg | ORAL_TABLET | Freq: Every day | ORAL | Status: DC
  Filled 2014-07-01 (×2): qty 0.5

## 2014-07-01 NOTE — Lactation Note (Addendum)
This note was copied from the chart of Megan Josepha Deshazo. Lactation Consultation Note  Patient Name: Megan Domingo PulseSahar Screws [id number]'s Date: 07/01/2014 Reason for consult: Follow-up assessment  Baby is 2551 hours old and mom has started supplementing due to baby acting like he is still hungry. Baby awake , Per  Mom the baby isn't catching my nipple. LC asked for clarification and per mom  The baby isn't staying at the breast for any length of time and still acting hungry.  LC assisted mom with latch on the left breast and noted the baby to open wide, sucks for a few times and falls  Asleep and releases ro when re-latched sits at the breast. Mom does have a short shaft nipple. LC suggested a Nipple Shield for latch , and per mom had already been given one. #16 NS on the counter , ( #16 NS is the appropriate fit )  LC washed it , reviewed application of NS , and baby latched with depth and swallows. LC noted the baby to be non -  Nutritive at times and needing stimulation to stay in a swallowing feeding pattern. Since mom has supplemented  With formula , LC showed mom how to instill formula under the NS to give the baby incentive to stay in a active feeding pattern. LC suggested to mom to continue and increase post pumping post breast for 10 -15 misn , and save milk to be used to instill in the NS , or supplemented after. Breast feeling warmer and fuller per mom. DEBP already set up, mom reports he has only post pumped x 2 in the last 24 hours.  Basin and  dish soap provided for cleaning of pump pieces and nipple shield. LC highly recommended to mom to use the #16 Nipple Shield for latching until the baby develops strong facial muscles and also until milk comes in .    Maternal Data Has patient been taught Hand Expression?: Yes  Feeding Feeding Type: Formula  LATCH Score/Interventions Latch:  (still latched ) Intervention(s): Teach feeding cues;Skin to skin;Waking techniques Intervention(s): Adjust  position;Assist with latch;Breast compression  Audible Swallowing: Spontaneous and intermittent  Type of Nipple: Everted at rest and after stimulation (short shaft nipple )  Comfort (Breast/Nipple): Soft / non-tender     Hold (Positioning): Assistance needed to correctly position infant at breast and maintain latch. Intervention(s): Breastfeeding basics reviewed;Support Pillows;Position options;Skin to skin  LATCH Score: 9  Lactation Tools Discussed/Used Tools: Nipple Shields Nipple shield size: 16 Breast pump type: Double-Electric Breast Pump   Consult Status Consult Status: Follow-up Date: 07/02/14 Follow-up type: In-patient    Megan Camacho, Megan Camacho 07/01/2014, 2:13 PM

## 2014-07-01 NOTE — Progress Notes (Signed)
POSTOPERATIVE DAY # 2 S/P Emergent C/S for Suspected Abruptio Placenta    S:         Reports feeling good             Tolerating po intake / no nausea / no vomiting / + flatus / no BM             Bleeding is light             Pain controlled with Motrin and Percocet             Up ad lib / ambulatory/ voiding QS  Newborn breast feeding - going well  / Circumcision - done 2/23   O:  VS: BP 105/71 mmHg  Pulse 90  Temp(Src) 98.3 F (36.8 C) (Oral)  Resp 18  Ht 5' (1.524 m)  Wt 68.312 kg (150 lb 9.6 oz)  BMI 29.41 kg/m2  SpO2 98%  Breastfeeding? Unknown   LABS:               Recent Labs  06/29/14 1205 06/30/14 0555  WBC 15.9* 9.8  HGB 10.4* 9.8*  PLT 206 191               Bloodtype: --/--/O POS, O POS (02/21 2255)  Rubella: 8.83 (02/23 0555)                                             I&O: Intake/Output      02/23 0701 - 02/24 0700 02/24 0701 - 02/25 0700   P.O. 700    I.V. (mL/kg)     Total Intake(mL/kg) 700 (10.2)    Urine (mL/kg/hr) 950 (0.6)    Blood     Total Output 950     Net -250           Mild fetal pyelectasis - Peds to follow              Physical Exam:             Alert and Oriented X3  Lungs: Clear and unlabored  Heart: regular rate and rhythm / no mumurs  Abdomen: soft, non-tender, non-distended, active bowel sounds             Fundus: firm, non-tender, U-2             Dressing: honeycomb dressing - clean / dry / intact              Incision:  approximated with staples / no erythema / no ecchymosis / no drainage  Perineum: intact  Lochia: light  Extremities: no edema, no calf pain or tenderness, negative Homans  A:        POD # 2 S/P Emergent C/S for Suspected Abruptio Placenta             ABL Anemia - begin Niferex today  Doing well - stable  P:        Routine postoperative care   Flatus comfort measures reviewed             Discharge home tomorrow   Plan staple removal in office 2/26 or 2/29  Milinda CaveMeredith Ottilie Wigglesworth, SNM

## 2014-07-02 MED ORDER — IBUPROFEN 600 MG PO TABS: 600.0000 mg | ORAL_TABLET | Freq: Four times a day (QID) | ORAL | Status: DC

## 2014-07-02 MED ORDER — MAGNESIUM OXIDE -MG SUPPLEMENT 400 (240 MG) MG PO TABS: 200.0000 mg | ORAL_TABLET | Freq: Every day | ORAL | Status: DC

## 2014-07-02 MED ORDER — OXYCODONE-ACETAMINOPHEN 5-325 MG PO TABS: 1.0000 | ORAL_TABLET | ORAL | Status: DC | PRN

## 2014-07-02 MED ORDER — POLYSACCHARIDE IRON COMPLEX 150 MG PO CAPS: 150.0000 mg | ORAL_CAPSULE | Freq: Every day | ORAL | Status: DC

## 2014-07-02 NOTE — Discharge Summary (Signed)
POSTOPERATIVE DISCHARGE SUMMARY:  Patient ID: Megan PulseSahar Mcveigh MRN: [id number] DOB/AGE: 1985-07-01 29 y.o.  Admit date: 06/28/2014 Admission Diagnoses: SROM clear fluid / irreg ctx   Discharge date:  07/02/2014 Discharge Diagnoses: S/P Primary C/S due to placental abruption and fetal bradycardia on 06/29/2014        Prenatal history: G2P1101   EDC : 06/28/2014, ultrasound [facility] since [redacted] wks gestation / transferred care. Primary provider : Dr. Ernestina PennaFogleman Prenatal course complicated by H/O 2nd trimester fetal loss (22 wks) despite rescue cerclage / cerclage this pregnancy / GDM - diet controlled  Prenatal Labs: ABO, Rh: O POS (02/21 2255) Antibody: NEG (02/21 2255) Rubella: Immune (02/23 0555)   RPR: Non Reactive (02/21 2255)  HBsAg: Negative (01/05 0000)  HIV: Non-reactive (01/05 0000)  GTT : abnormal 1 hr and 3 hr GTT GBS: Negative (01/29 0000)   Medical / Surgical History :  Past medical history:  Past Medical History  Diagnosis Date  . Gestational diabetes mellitus, antepartum   . Gestational diabetes     Past surgical history:  Past Surgical History  Procedure Laterality Date  . Cervical cerclage    . Wisdom tooth extraction    . Cesarean section N/A 06/29/2014    Procedure: CESAREAN SECTION;  Surgeon: Robley FriesVaishali R Mody, MD;  Location: [facility];  Service: Obstetrics;  Laterality: N/A;     Allergies: Review of patient's allergies indicates no known allergies.   Intrapartum Course:  Admitted for SROM clear fluid @ 6 pm. (+) irreg ctx / pitocin augmentation / epidural for pain management / new onset of vaginal bleeding and fetal heart decelerations on tracing- suspected marginal placental abruption - pitocin off / FHR decels resolve - pitocin resumed / prolonged decel in FHR not resolving with intrauterine resuscitation measures / Dr. Juliene PinaMody called to bedside for assistance / gradual return to baseline after 10+ mins /  continuing dark,red blood / decision for emergency cesarean delivery was made by Dr. Juliene PinaMody / Cesarean delivery of viable female by Dr. Juliene PinaMody - see operative not for details / no immediate postpartum complications noted  Physical Exam:   VSS: Blood pressure 93/58, Camacho 68, temperature 98 F (36.7 C), temperature source Oral, resp. rate 16, height 5' (1.524 m), weight 68.312 kg (150 lb 9.6 oz), SpO2 98 %, currently breastfeeding.  LABS:  Recent Labs  06/29/14 1205 06/30/14 0555  WBC 15.9* 9.8  HGB 10.4* 9.8*  PLT 206 191    Newborn Data Live born female on 06/29/2014 Birth Weight: 6 lb 10.4 oz (3015 g) APGAR: 8, 10  See operative report for further details  Home with mother.  Discharge Instructions:  Wound Care: keep clean and dry / return to [facility] for removal of honeycomb/staples POD 7 Postpartum Instructions: [facility] discharge booklet - instructions reviewed Medications:    Medication List    TAKE these medications        ibuprofen 600 MG tablet  Commonly known as:  ADVIL,MOTRIN  Take 1 tablet (600 mg total) by mouth every 6 (six) hours.     iron polysaccharides 150 MG capsule  Commonly known as:  NIFEREX  Take 1 capsule (150 mg total) by mouth daily.     Magnesium Oxide 400 (240 MG) MG Tabs  Take 200 mg by mouth daily.     oxyCODONE-acetaminophen 5-325 MG per tablet  Commonly known as:  PERCOCET/ROXICET  Take 1 tablet by mouth every 4 (four) hours as needed (for pain  scale less than 7).     PRENATAL VITAMINS PO  Take 1 each by mouth daily.           Follow-up Information    Follow up with [facility] A., MD. Schedule an appointment as soon as possible for a visit in 1 week.   Specialty:  Obstetrics and Gynecology   Why:  For staple removal with [facility] OFFICE NURSE   Contact information:   [address] [place] [address] [phone]       Follow up with Noland Fordyce A., MD. Schedule an appointment as soon as possible for a visit in 6  weeks.   Specialty:  Obstetrics and Gynecology   Why:  postpartum visit   Contact information:   [address] [place] [address] [phone]       Follow up with Noland Fordyce A., MD. Schedule an appointment as soon as possible for a visit in 6 weeks.   Specialty:  Obstetrics and Gynecology   Why:  FASTING 2 HOUR GLUCOSE TEST (same day as postpartum visit)   Contact information:   [address] [place] [address] [phone]         Signed: Kenard Gower, MSN, CNM 07/02/2014, 11:11 AM

## 2014-07-02 NOTE — Lactation Note (Signed)
This note was copied from the chart of Megan Xareni Loree. Lactation Consultation Note  Patient Name: Megan Camacho Reason for consult: Follow-up assessment;Other (Comment) (using a Nipple shield , see LC note )  Baby is 70 hours old, with 4 % weight loss, per mom has been using the #16 NS and the curved tip syringe  With EBM or formula as an appetizer instilled in the top of the NS. Per mom the baby has been staying latched so  much better and hearing more swallows. LC assessed breast tissue and checked the NS size , mom has been using  #16 NS,  Sized for #20 NS and LC discussed with mom with in the week if the #16 NS starts feeling tight , she will need to increase to #20 NS. Sore nipple and engorgement prevention and tx if needed. Per mom nipples aren't sore at present , breast are filling.  Mom receptive to coming back for a LC O/P F/U apt on 3/2 at 4 pm , apt reminder paper given to mom. Mother informed of post-discharge support and given phone number to the lactation department, including services for phone call assistance; out-patient  appointments; and breastfeeding support group. List of other breastfeeding resources in the community given in the handout. Encouraged mother to call for  problems or concerns related to breastfeeding.   Maternal Data    Feeding Feeding Type:  (per mom the baby last fed  at 0700 for 20 mins ) Length of feed: 20 min (per mom )  LATCH Score/Interventions                Intervention(s): Breastfeeding basics reviewed     Lactation Tools Discussed/Used     Consult Status Consult Status: Follow-up Date: 07/08/14 (4 pm , Apt reminder paper given to mom ) Follow-up type: Out-patient    Kathrin Greathouseorio, [name] [name], 9:06 AM

## 2014-07-02 NOTE — Progress Notes (Signed)
POSTOPERATIVE DAY # 3 S/P Emergent C/S for Suspected Abruptio Placenta and Fetal Bradycardia   S:         Reports feeling good             Tolerating po intake / no nausea / no vomiting / + flatus / no BM             Bleeding is light             Pain controlled with Motrin and Percocet             Up ad lib / ambulatory/ voiding QS  Newborn breast feeding - going well  / Circumcision - done 2/23   O:  VS: BP 93/58 mmHg  Pulse 68  Temp(Src) 98 F (36.7 C) (Oral)  Resp 16  Ht 5' (1.524 m)  Wt 68.312 kg (150 lb 9.6 oz)  BMI 29.41 kg/m2  SpO2 98%  Breastfeeding   LABS:                Recent Labs  06/29/14 1205 06/30/14 0555  WBC 15.9* 9.8  HGB 10.4* 9.8*  PLT 206 191               Bloodtype: --/--/O POS, O POS (02/21 2255)  Rubella: 8.83 (02/23 0555)                                             I&O: Intake/Output      02/24 0701 - 02/25 0700 02/25 0701 - 02/26 0700   P.O.     Total Intake(mL/kg)     Urine (mL/kg/hr)     Total Output       Net                         Physical Exam:             Alert and Oriented X3  Lungs: Clear and unlabored  Heart: regular rate and rhythm / no mumurs  Abdomen: soft, non-tender, non-distended, active bowel sounds             Fundus: firm, non-tender, U-3             Dressing: honeycomb dressing - clean / dry / intact              Incision:  Well-approximated with staples / no erythema / no ecchymosis / no drainage  Perineum: intact  Lochia: light  Extremities: no edema, no calf pain or tenderness, negative Homans  A:         POD # 3 / [id number] / S/P Emergent C/S for Suspected Abruptio Placenta and Fetal Bradycardia             ABL Anemia - on Niferex  Gestational Diabetes - delivered    Doing well - stable  P:        Routine postoperative care   Flatus comfort measures reviewed             Discharge home today  Plan staple removal in office 2/26 or 2/29  Schedule postpartum visit and fasting 2 hr GTT screen in 6  wks  Kenard GowerDAWSON, Chardonnay Holzmann, M MSN, CNM 07/02/2014 10:59 AM

## 2014-07-02 NOTE — Discharge Instructions (Signed)
Breast Pumping Tips °If you are breastfeeding, there may be times when you cannot feed your baby directly. Returning to work or going on a trip are common examples. Pumping allows you to store breast milk and feed it to your baby later.  °You may not get much milk when you first start to pump. Your breasts should start to make more after a few days. If you pump at the times you usually feed your baby, you may be able to keep making enough milk to feed your baby without also using formula. The more often you pump, the more milk you will produce.  °WHEN SHOULD I PUMP?  °· You can begin to pump soon after delivery. However, some experts recommend waiting about 4 weeks before giving your infant a bottle to make sure breastfeeding is going well.  °· If you plan to return to work, begin pumping a few weeks before. This will help you develop techniques that work best for you. It also lets you build up a supply of breast milk.   °· When you are with your infant, feed on demand and pump after each feeding.   °· When you are away from your infant for several hours, pump for about 15 minutes every 2-3 hours. Pump both breasts at the same time if you can.   °· If your infant has a formula feeding, make sure to pump around the same time.     °· If you drink any alcohol, wait 2 hours before pumping.   °HOW DO I PREPARE TO PUMP? °Your let-down reflex is the natural reaction to stimulation that makes your breast milk flow. It is easier to stimulate this reflex when you are relaxed. Find relaxation techniques that work for you. If you have difficulty with your let-down reflex, try these methods:  °· Smell one of your infant's blankets or an item of clothing.   °· Look at a picture or video of your infant.   °· Sit in a quiet, private space.   °· Massage the breast you plan to pump.   °· Place soothing warmth on the breast.   °· Play relaxing music.   °WHAT ARE SOME GENERAL BREAST PUMPING TIPS? °· Wash your hands before you pump. You  do not need to wash your nipples or breasts. °· There are three ways to pump. °¨ You can use your hand to massage and compress your breast. °¨ You can use a handheld manual pump. °¨ You can use an electric pump.   °· Make sure the suction cup (flange) on the breast pump is the right size. Place the flange directly over the nipple. If it is the wrong size or placed the wrong way, it may be painful and cause nipple damage.   °· If pumping is uncomfortable, apply a small amount of purified or modified lanolin to your nipple and areola. °· If you are using an electric pump, adjust the speed and suction power to be more comfortable. °· If pumping is painful or if you are not getting very much milk, you may need a different type of pump. A lactation consultant can help you determine what type of pump to use.   °· Keep a full water bottle near you at all times. Drinking lots of fluid helps you make more milk.  °· You can store your milk to use later. Pumped breast milk can be stored in a sealable, sterile container or plastic bag. Label all stored breast milk with the date you pumped it. °¨ Milk can stay out at room temperature for up to 8 hours. °¨   You can store your milk in the refrigerator for up to 8 days. °¨ You can store your milk in the freezer for 3 months. Thaw frozen milk using warm water. Do not put it in the microwave. °· Do not smoke. Smoking can lower your milk supply and harm your infant. If you need help quitting, ask your health care provider to recommend a program.   °WHEN SHOULD I CALL MY HEALTH CARE PROVIDER OR A LACTATION CONSULTANT? °· You are having trouble pumping. °· You are concerned that you are not making enough milk. °· You have nipple pain, soreness, or redness. °· You want to use birth control. Birth control pills may lower your milk supply. Talk to your health care provider about your options. °Document Released: 10/12/2009 Document Revised: 04/29/2013 Document Reviewed:  02/14/2013 °Exit[employer]® Patient Information ©2015 [employer]. This information is not intended to replace advice given to you by your health care provider. Make sure you discuss any questions you have with your health care provider. ° °Nutrition for the New Mother  °A new mother needs good health and nutrition so she can have energy to take care of a new baby. Whether a mother breastfeeds or formula feeds the baby, it is important to have a well-balanced diet. Foods from all the food groups should be chosen to meet the new mother's energy needs and to give her the nutrients needed for repair and healing.  °A HEALTHY EATING PLAN °The My [employer] plan for Moms outlines what you should eat to help you and your baby stay healthy. The energy and amount of food you need depends on whether or not you are breastfeeding. If you are breastfeeding you will need more nutrients. If you choose not to breastfeed, your nutrition goal should be to return to a healthy weight. Limiting calories may be needed if you are not breastfeeding.  °HOME CARE INSTRUCTIONS  °· For a personal plan based on your unique needs, see your Registered Dietitian or visit www.mypyramid.gov. °· Eat a variety of foods. The plan below will help guide you. The following chart has a suggested daily meal plan from the [employer] for Moms. °· Eat a variety of fruits and vegetables. °· Eat more dark green and orange vegetables and cooked dried beans. °· Make half your grains whole grains. Choose whole instead of refined grains. °· Choose low-fat or lean meats and poultry. °· Choose low-fat or fat-free dairy products like milk, cheese, or yogurt. °Fruits °· Breastfeeding: 2 cups °· Non-Breastfeeding: 2 cups °· What Counts as a serving? °¨ 1 cup of fruit or juice. °¨ ½ cup dried fruit. °Vegetables °· Breastfeeding: 3 cups °· Non-Breastfeeding: 2 ½ cups °· What Counts as a serving? °¨ 1 cup raw or cooked vegetables. °¨ Juice or 2 cups raw leafy  vegetables. °Grains °· Breastfeeding: 8 oz °· Non-Breastfeeding: 6 oz °· What Counts as a serving? °¨ 1 slice bread. °¨ 1 oz ready-to-eat cereal. °¨ ½ cup cooked pasta, rice, or cereal. °Meat and Beans °· Breastfeeding: 6 ½ oz °· Non-Breastfeeding: 5 ½ oz °· What Counts as a serving? °¨ 1 oz lean meat, poultry, or fish °¨ ¼ cup cooked dry beans °¨ ½ oz nuts or 1 egg °¨ 1 tbs peanut butter °Milk °· Breastfeeding: 3 cups °· Non-Breastfeeding: 3 cups °· What Counts as a serving? °¨ 1 cup milk. °¨ 8 oz yogurt. °¨ 1 ½ oz cheese. °¨ 2 oz processed cheese. °TIPS FOR THE BREASTFEEDING MOM °· Rapid weight   loss is not suggested when you are breastfeeding. By simply breastfeeding, you will be able to lose the weight gained during your pregnancy. Your caregiver can keep track of your weight and tell you if your weight loss is appropriate. °· Be sure to drink fluids. You may notice that you are thirstier than usual. A suggestion is to drink a glass of water or other beverage whenever you breastfeed. °· Avoid alcohol as it can be passed into your breast milk. °· Limit caffeine drinks to no more than 2 to 3 cups per day. °· You may need to keep taking your prenatal vitamin while you are breastfeeding. Talk with your caregiver about taking a vitamin or supplement. °RETURING TO A HEALTHY WEIGHT °· The My [employer] Plan for Moms will help you return to a healthy weight. It will also provide the nutrients you need. °· You may need to limit "empty" calories. These include: °¨ High fat foods like fried foods, fatty meats, fast food, butter, and mayonnaise. °¨ High sugar foods like sodas, jelly, candy, and sweets. °· Be physically active. Include 30 minutes of exercise or more each day. Choose an activity you like such as walking, swimming, biking, or aerobics. Check with your caregiver before you start to exercise. °Document Released: 08/01/2007 Document Revised: 07/17/2011 Document Reviewed: 08/01/2007 °ExitCare® Patient Information  ©2015 [employer]. This information is not intended to replace advice given to you by your health care provider. Make sure you discuss any questions you have with your health care provider. °Postpartum Depression and Baby Blues °The postpartum period begins right after the birth of a baby. During this time, there is often a great amount of joy and excitement. It is also a time of many changes in the life of the parents. Regardless of how many times a mother gives birth, each child brings new challenges and dynamics to the family. It is not unusual to have feelings of excitement along with confusing shifts in moods, emotions, and thoughts. All mothers are at risk of developing postpartum depression or the "baby blues." These mood changes can occur right after giving birth, or they may occur many months after giving birth. The baby blues or postpartum depression can be mild or severe. Additionally, postpartum depression can go away rather quickly, or it can be a long-term condition.  °CAUSES °Raised hormone levels and the rapid drop in those levels are thought to be a main cause of postpartum depression and the baby blues. A number of hormones change during and after pregnancy. Estrogen and progesterone usually decrease right after the delivery of your baby. The levels of thyroid hormone and various cortisol steroids also rapidly drop. Other factors that play a role in these mood changes include major life events and genetics.  °RISK FACTORS °If you have any of the following risks for the baby blues or postpartum depression, know what symptoms to watch out for during the postpartum period. Risk factors that may increase the likelihood of getting the baby blues or postpartum depression include: °· Having a personal or family history of depression.   °· Having depression while being pregnant.   °· Having premenstrual mood issues or mood issues related to oral contraceptives. °· Having a lot of life stress.   °· Having  marital conflict.   °· Lacking a social support network.   °· Having a baby with special needs.   °· Having health problems, such as diabetes.   °SIGNS AND SYMPTOMS °Symptoms of baby blues include: °· Brief changes in mood, such as going   from extreme happiness to sadness. °· Decreased concentration.   °· Difficulty sleeping.   °· Crying spells, tearfulness.   °· Irritability.   °· Anxiety.   °Symptoms of postpartum depression typically begin within the first month after giving birth. These symptoms include: °· Difficulty sleeping or excessive sleepiness.   °· Marked weight loss.   °· Agitation.   °· Feelings of worthlessness.   °· Lack of interest in activity or food.   °Postpartum psychosis is a very serious condition and can be dangerous. Fortunately, it is rare. Displaying any of the following symptoms is cause for immediate medical attention. Symptoms of postpartum psychosis include:  °· Hallucinations and delusions.   °· Bizarre or disorganized behavior.   °· Confusion or disorientation.   °DIAGNOSIS  °A diagnosis is made by an evaluation of your symptoms. There are no medical or lab tests that lead to a diagnosis, but there are various questionnaires that a health care provider may use to identify those with the baby blues, postpartum depression, or psychosis. Often, a screening tool called the Edinburgh Postnatal Depression Scale is used to diagnose depression in the postpartum period.  °TREATMENT °The baby blues usually goes away on its own in 1-2 weeks. Social support is often all that is needed. You will be encouraged to get adequate sleep and rest. Occasionally, you may be given medicines to help you sleep.  °Postpartum depression requires treatment because it can last several months or longer if it is not treated. Treatment may include individual or group therapy, medicine, or both to address any social, physiological, and psychological factors that may play a role in the depression. Regular exercise, a  healthy diet, rest, and social support may also be strongly recommended.  °Postpartum psychosis is more serious and needs treatment right away. Hospitalization is often needed. °HOME CARE INSTRUCTIONS °· Get as much rest as you can. Nap when the baby sleeps.   °· Exercise regularly. Some women find yoga and walking to be beneficial.   °· Eat a balanced and nourishing diet.   °· Do little things that you enjoy. Have a cup of tea, take a bubble bath, read your favorite magazine, or listen to your favorite music. °· Avoid alcohol.   °· Ask for help with household chores, cooking, grocery shopping, or running errands as needed. Do not try to do everything.   °· Talk to people close to you about how you are feeling. Get support from your partner, family members, friends, or other new moms. °· Try to stay positive in how you think. Think about the things you are grateful for.   °· Do not spend a lot of time alone.   °· Only take over-the-counter or prescription medicine as directed by your health care provider. °· Keep all your postpartum appointments.   °· Let your health care provider know if you have any concerns.   °SEEK MEDICAL CARE IF: °You are having a reaction to or problems with your medicine. °SEEK IMMEDIATE MEDICAL CARE IF: °· You have suicidal feelings.   °· You think you may harm the baby or someone else. °MAKE SURE YOU: °· Understand these instructions. °· Will watch your condition. °· Will get help right away if you are not doing well or get worse. °Document Released: 01/27/2004 Document Revised: 04/29/2013 Document Reviewed: 02/03/2013 °Exit[employer]® Patient Information ©2015 [employer]. This information is not intended to replace advice given to you by your health care provider. Make sure you discuss any questions you have with your health care provider. °Breastfeeding and Mastitis °Mastitis is inflammation of the breast tissue. It can occur in women who   are breastfeeding. This can make breastfeeding  painful. Mastitis will sometimes go away on its own. Your health care provider will help determine if treatment is needed. °CAUSES °Mastitis is often associated with a blocked milk (lactiferous) duct. This can happen when too much milk builds up in the breast. Causes of excess milk in the breast can include: °· Poor latch-on. If your baby is not latched onto the breast properly, she or he may not empty your breast completely while breastfeeding. °· Allowing too much time to pass between feedings. °· Wearing a bra or other clothing that is too tight. This puts extra pressure on the lactiferous ducts so milk does not flow through them as it should. °Mastitis can also be caused by a bacterial infection. Bacteria may enter the breast tissue through cuts or openings in the skin. In women who are breastfeeding, this may occur because of cracked or irritated skin. Cracks in the skin are often caused when your baby does not latch on properly to the breast. °SIGNS AND SYMPTOMS °· Swelling, redness, tenderness, and pain in an area of the breast. °· Swelling of the glands under the arm on the same side. °· Fever may or may not accompany mastitis. °If an infection is allowed to progress, a collection of pus (abscess) may develop. °DIAGNOSIS  °Your health care provider can usually diagnose mastitis based on your symptoms and a physical exam. Tests may be done to help confirm the diagnosis. These may include: °· Removal of pus from the breast by applying pressure to the area. This pus can be examined in the lab to determine which bacteria are present. If an abscess has developed, the fluid in the abscess can be removed with a needle. This can also be used to confirm the diagnosis and determine the bacteria present. In most cases, pus will not be present. °· Blood tests to determine if your body is fighting a bacterial infection. °· Mammogram or ultrasound tests to rule out other problems or diseases. °TREATMENT  °Mastitis that  occurs with breastfeeding will sometimes go away on its own. Your health care provider may choose to wait 24 hours after first seeing you to decide whether a prescription medicine is needed. If your symptoms are worse after 24 hours, your health care provider will likely prescribe an antibiotic medicine to treat the mastitis. He or she will determine which bacteria are most likely causing the infection and will then select an appropriate antibiotic medicine. This is sometimes changed based on the results of tests performed to identify the bacteria, or if there is no response to the antibiotic medicine selected. Antibiotic medicines are usually given by mouth. You may also be given medicine for pain. °HOME CARE INSTRUCTIONS °· Only take over-the-counter or prescription medicines for pain, fever, or discomfort as directed by your health care provider. °· If your health care provider prescribed an antibiotic medicine, take the medicine as directed. Make sure you finish it even if you start to feel better. °· Do not wear a tight or underwire bra. Wear a soft, supportive bra. °· Increase your fluid intake, especially if you have a fever. °· Continue to empty the breast. Your health care provider can tell you whether this milk is safe for your infant or needs to be thrown out. You may be told to stop nursing until your health care provider thinks it is safe for your baby. Use a breast pump if you are advised to stop nursing. °· Keep your nipples   clean and dry. °· Empty the first breast completely before going to the other breast. If your baby is not emptying your breasts completely for some reason, use a breast pump to empty your breasts. °· If you go back to work, pump your breasts while at work to stay in time with your nursing schedule. °· Avoid allowing your breasts to become overly filled with milk (engorged). °SEEK MEDICAL CARE IF: °· You have pus-like discharge from the breast. °· Your symptoms do not improve with  the treatment prescribed by your health care provider within 2 days. °SEEK IMMEDIATE MEDICAL CARE IF: °· Your pain and swelling are getting worse. °· You have pain that is not controlled with medicine. °· You have a red line extending from the breast toward your armpit. °· You have a fever or persistent symptoms for more than 2-3 days. °· You have a fever and your symptoms suddenly get worse. °MAKE SURE YOU:  °· Understand these instructions. °· Will watch your condition. °· Will get help right away if you are not doing well or get worse. °Document Released: 08/19/2004 Document Revised: 04/29/2013 Document Reviewed: 11/28/2012 °[employer]® Patient Information ©2015 [employer]. This information is not intended to replace advice given to you by your health care provider. Make sure you discuss any questions you have with your health care provider. °Breastfeeding °Deciding to breastfeed is one of the best choices you can make for you and your baby. A change in hormones during pregnancy causes your breast tissue to grow and increases the number and size of your milk ducts. These hormones also allow proteins, sugars, and fats from your blood supply to make breast milk in your milk-producing glands. Hormones prevent breast milk from being released before your baby is born as well as prompt milk flow after birth. Once breastfeeding has begun, thoughts of your baby, as well as his or her sucking or crying, can stimulate the release of milk from your milk-producing glands.  °BENEFITS OF BREASTFEEDING °For Your Baby °· Your first milk (colostrum) helps your baby's digestive system function better.   °· There are antibodies in your milk that help your baby fight off infections.   °· Your baby has a lower incidence of asthma, allergies, and sudden infant death syndrome.   °· The nutrients in breast milk are better for your baby than infant formulas and are designed uniquely for your baby's needs.   °· Breast milk improves your  baby's brain development.   °· Your baby is less likely to develop other conditions, such as childhood obesity, asthma, or type 2 diabetes mellitus.   °For You  °· Breastfeeding helps to create a very special bond between you and your baby.   °· Breastfeeding is convenient. Breast milk is always available at the correct temperature and costs nothing.   °· Breastfeeding helps to burn calories and helps you lose the weight gained during pregnancy.   °· Breastfeeding makes your uterus contract to its prepregnancy size faster and slows bleeding (lochia) after you give birth.   °· Breastfeeding helps to lower your risk of developing type 2 diabetes mellitus, osteoporosis, and breast or ovarian cancer later in life. °SIGNS THAT YOUR BABY IS HUNGRY °Early Signs of Hunger  °· Increased alertness or activity. °· Stretching. °· Movement of the head from side to side. °· Movement of the head and opening of the mouth when the corner of the mouth or cheek is stroked (rooting). °· Increased sucking sounds, smacking lips, cooing, sighing, or squeaking. °· Hand-to-mouth movements. °· Increased sucking of   fingers or hands. °Late Signs of Hunger °· Fussing. °· Intermittent crying. °Extreme Signs of Hunger °Signs of extreme hunger will require calming and consoling before your baby will be able to breastfeed successfully. Do not wait for the following signs of extreme hunger to occur before you initiate breastfeeding:   °· Restlessness. °· A loud, strong cry. °·  Screaming. °BREASTFEEDING BASICS °Breastfeeding Initiation °· Find a comfortable place to sit or lie down, with your neck and back well supported. °· Place a pillow or rolled up blanket under your baby to bring him or her to the level of your breast (if you are seated). Nursing pillows are specially designed to help support your arms and your baby while you breastfeed. °· Make sure that your baby's abdomen is facing your abdomen.   °· Gently massage your breast. With your  fingertips, massage from your chest wall toward your nipple in a circular motion. This encourages milk flow. You may need to continue this action during the feeding if your milk flows slowly. °· Support your breast with 4 fingers underneath and your thumb above your nipple. Make sure your fingers are well away from your nipple and your baby's mouth.   °· Stroke your baby's lips gently with your finger or nipple.   °· When your baby's mouth is open wide enough, quickly bring your baby to your breast, placing your entire nipple and as much of the colored area around your nipple (areola) as possible into your baby's mouth.   °¨ More areola should be visible above your baby's upper lip than below the lower lip.   °¨ Your baby's tongue should be between his or her lower gum and your breast.   °· Ensure that your baby's mouth is correctly positioned around your nipple (latched). Your baby's lips should create a seal on your breast and be turned out (everted). °· It is common for your baby to suck about 2-3 minutes in order to start the flow of breast milk. °Latching °Teaching your baby how to latch on to your breast properly is very important. An improper latch can cause nipple pain and decreased milk supply for you and poor weight gain in your baby. Also, if your baby is not latched onto your nipple properly, he or she may swallow some air during feeding. This can make your baby fussy. Burping your baby when you switch breasts during the feeding can help to get rid of the air. However, teaching your baby to latch on properly is still the best way to prevent fussiness from swallowing air while breastfeeding. °Signs that your baby has successfully latched on to your nipple:    °· Silent tugging or silent sucking, without causing you pain.   °· Swallowing heard between every 3-4 sucks.   °·  Muscle movement above and in front of his or her ears while sucking.   °Signs that your baby has not successfully latched on to  nipple:  °· Sucking sounds or smacking sounds from your baby while breastfeeding. °· Nipple pain. °If you think your baby has not latched on correctly, slip your finger into the corner of your baby's mouth to break the suction and place it between your baby's gums. Attempt breastfeeding initiation again. °Signs of Successful Breastfeeding °Signs from your baby:   °· A gradual decrease in the number of sucks or complete cessation of sucking.   °· Falling asleep.   °· Relaxation of his or her body.   °· Retention of a small amount of milk in his or her mouth.   °· Letting go   of your breast by himself or herself. °Signs from you: °· Breasts that have increased in firmness, weight, and size 1-3 hours after feeding.   °· Breasts that are softer immediately after breastfeeding. °· Increased milk volume, as well as a change in milk consistency and color by the fifth day of breastfeeding.   °· Nipples that are not sore, cracked, or bleeding. °Signs That Your Baby is Getting Enough Milk °· Wetting at least 3 diapers in a 24-hour period. The urine should be clear and pale yellow by age 5 days. °· At least 3 stools in a 24-hour period by age 5 days. The stool should be soft and yellow. °· At least 3 stools in a 24-hour period by age 7 days. The stool should be seedy and yellow. °· No loss of weight greater than 10% of birth weight during the first 3 days of age. °· Average weight gain of 4-7 ounces (113-198 g) per week after age 4 days. °· Consistent daily weight gain by age 5 days, without weight loss after the age of 2 weeks. °After a feeding, your baby may spit up a small amount. This is common. °BREASTFEEDING FREQUENCY AND DURATION °Frequent feeding will help you make more milk and can prevent sore nipples and breast engorgement. Breastfeed when you feel the need to reduce the fullness of your breasts or when your baby shows signs of hunger. This is called "breastfeeding on demand." Avoid introducing a pacifier to your  baby while you are working to establish breastfeeding (the first 4-6 weeks after your baby is born). After this time you may choose to use a pacifier. Research has shown that pacifier use during the first year of a baby's life decreases the risk of sudden infant death syndrome (SIDS). °Allow your baby to feed on each breast as long as he or she wants. Breastfeed until your baby is finished feeding. When your baby unlatches or falls asleep while feeding from the first breast, offer the second breast. Because newborns are often sleepy in the first few weeks of life, you may need to awaken your baby to get him or her to feed. °Breastfeeding times will vary from baby to baby. However, the following rules can serve as a guide to help you ensure that your baby is properly fed: °· Newborns (babies 4 weeks of age or younger) may breastfeed every 1-3 hours. °· Newborns should not go longer than 3 hours during the day or 5 hours during the night without breastfeeding. °· You should breastfeed your baby a minimum of 8 times in a 24-hour period until you begin to introduce solid foods to your baby at around 6 months of age. °BREAST MILK PUMPING °Pumping and storing breast milk allows you to ensure that your baby is exclusively fed your breast milk, even at times when you are unable to breastfeed. This is especially important if you are going back to work while you are still breastfeeding or when you are not able to be present during feedings. Your lactation consultant can give you guidelines on how long it is safe to store breast milk.  °A breast pump is a machine that allows you to pump milk from your breast into a sterile bottle. The pumped breast milk can then be stored in a refrigerator or freezer. Some breast pumps are operated by hand, while others use electricity. Ask your lactation consultant which type will work best for you. Breast pumps can be purchased, but some hospitals and breastfeeding support groups   lease  breast pumps on a monthly basis. A lactation consultant can teach you how to hand express breast milk, if you prefer not to use a pump.  °CARING FOR YOUR BREASTS WHILE YOU BREASTFEED °Nipples can become dry, cracked, and sore while breastfeeding. The following recommendations can help keep your breasts moisturized and healthy: °· Avoid using soap on your nipples.   °· Wear a supportive bra. Although not required, special nursing bras and tank tops are designed to allow access to your breasts for breastfeeding without taking off your entire bra or top. Avoid wearing underwire-style bras or extremely tight bras. °· Air dry your nipples for 3-4 minutes after each feeding.   °· Use only cotton bra pads to absorb leaked breast milk. Leaking of breast milk between feedings is normal.   °· Use lanolin on your nipples after breastfeeding. Lanolin helps to maintain your skin's normal moisture barrier. If you use pure lanolin, you do not need to wash it off before feeding your baby again. Pure lanolin is not toxic to your baby. You may also hand express a few drops of breast milk and gently massage that milk into your nipples and allow the milk to air dry. °In the first few weeks after giving birth, some women experience extremely full breasts (engorgement). Engorgement can make your breasts feel heavy, warm, and tender to the touch. Engorgement peaks within 3-5 days after you give birth. The following recommendations can help ease engorgement: °· Completely empty your breasts while breastfeeding or pumping. You may want to start by applying warm, moist heat (in the shower or with warm water-soaked hand towels) just before feeding or pumping. This increases circulation and helps the milk flow. If your baby does not completely empty your breasts while breastfeeding, pump any extra milk after he or she is finished. °· Wear a snug bra (nursing or regular) or tank top for 1-2 days to signal your body to slightly decrease milk  production. °· Apply ice packs to your breasts, unless this is too uncomfortable for you. °· Make sure that your baby is latched on and positioned properly while breastfeeding. °If engorgement persists after 48 hours of following these recommendations, contact your health care provider or a lactation consultant. °OVERALL HEALTH CARE RECOMMENDATIONS WHILE BREASTFEEDING °· Eat healthy foods. Alternate between meals and snacks, eating 3 of each per day. Because what you eat affects your breast milk, some of the foods may make your baby more irritable than usual. Avoid eating these foods if you are sure that they are negatively affecting your baby. °· Drink milk, fruit juice, and water to satisfy your thirst (about 10 glasses a day).   °· Rest often, relax, and continue to take your prenatal vitamins to prevent fatigue, stress, and anemia. °· Continue breast self-awareness checks. °· Avoid chewing and smoking tobacco. °· Avoid alcohol and drug use. °Some medicines that may be harmful to your baby can pass through breast milk. It is important to ask your health care provider before taking any medicine, including all over-the-counter and prescription medicine as well as vitamin and herbal supplements. °It is possible to become pregnant while breastfeeding. If birth control is desired, ask your health care provider about options that will be safe for your baby. °SEEK MEDICAL CARE IF:  °· You feel like you want to stop breastfeeding or have become frustrated with breastfeeding. °· You have painful breasts or nipples. °· Your nipples are cracked or bleeding. °· Your breasts are red, tender, or warm. °· You have   a swollen area on either breast. °· You have a fever or chills. °· You have nausea or vomiting. °· You have drainage other than breast milk from your nipples. °· Your breasts do not become full before feedings by the fifth day after you give birth. °· You feel sad and depressed. °· Your baby is too sleepy to eat  well. °· Your baby is having trouble sleeping.   °· Your baby is wetting less than 3 diapers in a 24-hour period. °· Your baby has less than 3 stools in a 24-hour period. °· Your baby's skin or the white part of his or her eyes becomes yellow.   °· Your baby is not gaining weight by 5 days of age. °SEEK IMMEDIATE MEDICAL CARE IF:  °· Your baby is overly tired (lethargic) and does not want to wake up and feed. °· Your baby develops an unexplained fever. °Document Released: 04/24/2005 Document Revised: 04/29/2013 Document Reviewed: 10/16/2012 °[employer]® Patient Information ©2015 [employer]. This information is not intended to replace advice given to you by your health care provider. Make sure you discuss any questions you have with your health care provider. ° °

## 2014-07-08 ENCOUNTER — Ambulatory Visit (HOSPITAL_COMMUNITY): Admit: 2014-07-08 | Payer: PPO

## 2014-09-02 ENCOUNTER — Other Ambulatory Visit: Payer: Self-pay | Admitting: *Deleted

## 2014-09-02 ENCOUNTER — Encounter: Payer: Self-pay | Admitting: Vascular Surgery

## 2014-09-02 DIAGNOSIS — M79642 Pain in left hand: Secondary | ICD-10-CM

## 2014-09-24 ENCOUNTER — Encounter: Payer: Self-pay | Admitting: Vascular Surgery

## 2014-09-25 ENCOUNTER — Ambulatory Visit (HOSPITAL_COMMUNITY)
Admission: RE | Admit: 2014-09-25 | Discharge: 2014-09-25 | Disposition: A | Discharge status: Home / Self Care | Payer: PPO | Source: Ambulatory Visit | Attending: Vascular Surgery | Admitting: Vascular Surgery

## 2014-09-25 ENCOUNTER — Encounter: Payer: Self-pay | Admitting: Vascular Surgery

## 2014-09-25 ENCOUNTER — Ambulatory Visit (INDEPENDENT_AMBULATORY_CARE_PROVIDER_SITE_OTHER): Payer: PPO | Admitting: Vascular Surgery

## 2014-09-25 VITALS — BP 107/70 | HR 92 | Ht 60.0 in | Wt 136.7 lb

## 2014-09-25 DIAGNOSIS — I809 Phlebitis and thrombophlebitis of unspecified site: Secondary | ICD-10-CM

## 2014-09-25 DIAGNOSIS — M79642 Pain in left hand: Secondary | ICD-10-CM | POA: Insufficient documentation

## 2014-09-25 NOTE — Progress Notes (Signed)
CONSULT    CC:  Pain in left hand x 1 year  Ref[facility] Provider[facility]  [facility]   HPI: This is a 29 y.o. female who states that ~ one year ago, she was hospitalized for pregnancy complications and spontaneous abortion.  She had an IV placed in her left hand and since then, she has had pain and redness.  She states that she does have some pain that radiates into the arm as well.   She states that it has not worsened or gotten better.  She does have intermittent swelling with the last time ~ one month ago.  She states it hurts at night.  She has tried wearing a glove, but this has not helped.  She has not tried warm compresses.    She does not have any other medical issues.   Past Medical History  Diagnosis Date  . Gestational diabetes mellitus, antepartum   . Gestational diabetes     Past Surgical History  Procedure Laterality Date  . Cervical cerclage    . Wisdom tooth extraction    . Cesarean section N/A 06/29/2014    Procedure: CESAREAN SECTION;  Surgeon: Robley FriesVaishali R Mody, MD;  Location: [facility];  Service: Obstetrics;  Laterality: N/A;    No Known Allergies  Current Outpatient Prescriptions  Medication Sig Dispense Refill  . ibuprofen (ADVIL,MOTRIN) 600 MG tablet Take 1 tablet (600 mg total) by mouth every 6 (six) hours. 30 tablet 0  . iron polysaccharides (NIFEREX) 150 MG capsule Take 1 capsule (150 mg total) by mouth daily. 30 capsule 3  . Magnesium Oxide 400 (240 MG) MG TABS Take 200 mg by mouth daily. 30 tablet 3  . oxyCODONE-acetaminophen (PERCOCET/ROXICET) 5-325 MG per tablet Take 1 tablet by mouth every 4 (four) hours as needed (for pain scale less than 7). 30 tablet 0  . Prenatal Multivit-Min-Fe-FA (PRENATAL VITAMINS PO) Take 1 each by mouth daily.     No current facility-administered medications for this visit.    Family History  Problem Relation Age of Onset  . Diabetes Father     History   Social History  . Marital Status: Married    Spouse Name: N/A   . Number of Children: N/A  . Years of Education: N/A   Occupational History  . Not on file.   Social History Main Topics  . Smoking status: Never Smoker   . Smokeless tobacco: Not on file  . Alcohol Use: No  . Drug Use: No  . Sexual Activity: Not on file   Other Topics Concern  . Not on file   Social History Narrative     ROS: [x]  Positive   [ ]  Negative   [ ]  All sytems reviewed and are negative  Cardiovascular: []  chest pain/pressure []  palpitations []  SOB lying flat []  DOE []  pain in legs while walking []  pain in feet when lying flat []  hx of DVT []  hx of phlebitis []  swelling in legs []  varicose veins  Pulmonary: []  productive cough []  asthma []  wheezing  Neurologic: [x]  weakness in [x]  arms []  legs [x]  numbness in [x]  arms []  legs [] difficulty speaking or slurred speech []  temporary loss of vision in one eye []  dizziness  Hematologic: []  bleeding problems []  problems with blood clotting easily  GI []  vomiting blood []  blood in stool  GU: []  burning with urination []  blood in urine  Psychiatric: []  hx of major depression  Integumentary: []  rashes []  ulcers  Constitutional: []   fever []  chills   PHYSICAL EXAMINATION:  Filed Vitals:   09/25/14 1332  BP: 107/70  Pulse: 92   Body mass index is 26.69 kg/(m^2).  General:  WDWN in NAD Gait: Not observed HENT: WNL, normocephalic Pulmonary: normal non-labored breathing , without Rales, rhonchi,  wheezing Cardiac: RRR, without  Murmurs, rubs or gallops Abdomen: soft, NT, no masses Skin: without rashes, without ulcers  Vascular Exam/Pulses:  Right Left  Radial 2+ (normal) 2+ (normal)  DP 2+ (normal) 2+ (normal)   Extremities: there is mild erythema over the vein over the wrist; there is no edema. without ischemic changes, without Gangrene , without cellulitis; without open wounds;  Musculoskeletal: no muscle wasting or atrophy  Neurologic: A&O X 3; Appropriate Affect ; SENSATION:  normal; MOTOR FUNCTION:  moving all extremities equally. Speech is fluent/normal   Non-Invasive Vascular Imaging:   Upper Extremity Venous Duplex 09/25/14: 1.  No evidence of LUE DVT 2.  There is a chronic focal thrombus noted in a superficial vein on the top of the left hand.  Pt meds includes: Statin:  No. Beta Blocker:  No. Aspirin:  No. ACEI:  No. ARB:  No. Other Antiplatelet/Anticoagulant:  No.    ASSESSMENT/PLAN:: 29 y.o. female with superficial thrombophlebitis  -this is not a life threatening issue as it is superficial.  She is encouraged to use warm compresses over the area and try NSAIDs for inflammation -if this is persistent and continues to bother her in three months, she will return and we will discuss the option of resection of the vein, but would not pursue that initially. -she will f/u prn    Doreatha MassedSamantha Raye Wiens, PA-C [facility] [phone]  Clinic MD:  Pt seen and examined in conjunction with Dr. Imogene Burnhen  Addendum  I have independently interviewed and examined the patient, and I agree with the physician assistant's findings.  This patient has a chronic SVT.  I would manage this sx as I doubt resection of the vein will provide much more relief.  Leonides SakeBrian Chen, MD [facility] [place] Office: [phone] Pager: [phone]  09/25/2014, 2:28 PM

## 2015-08-15 ENCOUNTER — Emergency Department (HOSPITAL_COMMUNITY): Payer: PPO

## 2015-08-15 ENCOUNTER — Encounter (HOSPITAL_COMMUNITY): Payer: Self-pay | Admitting: *Deleted

## 2015-08-15 ENCOUNTER — Emergency Department (HOSPITAL_COMMUNITY)
Admission: EM | Admit: 2015-08-15 | Discharge: 2015-08-16 | Disposition: A | Discharge status: Home / Self Care | Payer: PPO | Attending: Emergency Medicine | Admitting: Emergency Medicine

## 2015-08-15 DIAGNOSIS — I8002 Phlebitis and thrombophlebitis of superficial vessels of left lower extremity: Secondary | ICD-10-CM

## 2015-08-15 DIAGNOSIS — I808 Phlebitis and thrombophlebitis of other sites: Secondary | ICD-10-CM | POA: Insufficient documentation

## 2015-08-15 DIAGNOSIS — Z8632 Personal history of gestational diabetes: Secondary | ICD-10-CM | POA: Diagnosis not present

## 2015-08-15 DIAGNOSIS — Z79899 Other long term (current) drug therapy: Secondary | ICD-10-CM | POA: Diagnosis not present

## 2015-08-15 DIAGNOSIS — M79642 Pain in left hand: Secondary | ICD-10-CM | POA: Diagnosis present

## 2015-08-15 NOTE — ED Notes (Signed)
The pt is c/o lt hand pain she thinks is swollen  She had an iv there two years ago and the pain is still there.  lmp march

## 2015-08-15 NOTE — ED Provider Notes (Signed)
History  By signing my name below, I, Megan Camacho, attest that this documentation has been prepared under the direction and in the presence of [employer], PA-C. Electronically Signed: Karle Camacho, ED Scribe. 08/15/2015. 11:45 PM.  Chief Complaint  Patient presents with  . Hand Pain   The history is provided by the patient and medical records. No language interpreter was used.    HPI Comments:  Megan Camacho is a 30 y.o. female who presents to the Emergency Department complaining of left hand pain that has been ongoing for the past two years. She states she had an IV placed that got infected in which she was treated with antibiotics. She reports the pain comes about twice every month but resolves on its own or with OTC pain medication. She states the pain was more intense today because she was cleaning her house. She has not taken anything for pain today. She denies modifying factors. She denies any new trauma or injury. She denies any wounds, numbness, tingling or weakness of the left hand, fever, chills, nausea or vomiting.   Past Medical History  Diagnosis Date  . Gestational diabetes mellitus, antepartum   . Gestational diabetes    Past Surgical History  Procedure Laterality Date  . Cervical cerclage    . Wisdom tooth extraction    . Cesarean section N/A 06/29/2014    Procedure: CESAREAN SECTION;  Surgeon: Robley Fries, MD;  Location: [facility];  Service: Obstetrics;  Laterality: N/A;   Family History  Problem Relation Age of Onset  . Diabetes Father    Social History  Substance Use Topics  . Smoking status: Never Smoker   . Smokeless tobacco: None  . Alcohol Use: No   OB History    Gravida Para Term Preterm AB TAB SAB Ectopic Multiple Living   0 1     Review of Systems A complete 10 system review of systems was obtained and all systems are negative except as noted in the HPI and PMH.   Allergies  Review of patient's allergies indicates no  known allergies.  Home Medications   Prior to Admission medications   Medication Sig Start Date End Date Taking? Authorizing Provider  ibuprofen (ADVIL,MOTRIN) 600 MG tablet Take 1 tablet (600 mg total) by mouth every 6 (six) hours. 07/02/14   Megan Camacho, CNM  iron polysaccharides (NIFEREX) 150 MG capsule Take 1 capsule (150 mg total) by mouth daily. 07/02/14   Megan Camacho, CNM  magnesium oxide (MAG-OX) 400 (241.3 MG) MG tablet  07/02/14   Historical Provider, MD  Magnesium Oxide 400 (240 MG) MG TABS Take 200 mg by mouth daily. 07/02/14   Megan Camacho, CNM  oxyCODONE-acetaminophen (PERCOCET/ROXICET) 5-325 MG per tablet Take 1 tablet by mouth every 4 (four) hours as needed (for pain scale less than 7). 07/02/14   Megan Camacho, CNM  Prenatal Multivit-Min-Fe-FA (PRENATAL VITAMINS PO) Take 1 each by mouth daily.    Historical Provider, MD   Triage Vitals: BP 121/84 mmHg  Pulse 77  Temp(Src) 97.9 F (36.6 C) (Oral)  Resp 20  Ht 5' (1.524 m)  Wt 127 lb 3 oz (57.692 kg)  BMI 24.84 kg/m2  SpO2 98%  LMP 07/15/2015  Breastfeeding? Yes Physical Exam  Constitutional: She is oriented to person, place, and time. She appears well-developed and well-nourished.  HENT:  Head: Normocephalic and atraumatic.  Eyes: EOM are normal.  Neck: Normal range of motion.  Cardiovascular: Normal rate  and regular rhythm.   Radial pulse 2+. Good capillary refill of fingers of left hand.  Pulmonary/Chest: Effort normal and breath sounds normal.  Musculoskeletal: Normal range of motion.  Small area of erythema to dorsal aspect of left wrist. No warmth. No swelling.  Neurological: She is alert and oriented to person, place, and time.  Skin: Skin is warm and dry.  Psychiatric: She has a normal mood and affect. Her behavior is normal.  Nursing note and vitals reviewed.   ED Course  Procedures (including critical care time) DIAGNOSTIC STUDIES: Oxygen Saturation is 98% on RA, normal by my interpretation.    COORDINATION OF CARE: 11:13 PM- Will X-Ray left hand. Pt verbalizes understanding and agrees to plan.  Medications - No data to display  Labs Review Labs Reviewed - No data to display  Imaging Review Dg Hand Complete Left  08/15/2015  CLINICAL DATA:  Persistent pain after IV 2 years ago. EXAM: LEFT HAND - COMPLETE 3+ VIEW COMPARISON:  None. FINDINGS: Negative for fracture, dislocation or radiopaque foreign body. No significant soft tissue abnormality is evident. IMPRESSION: Negative. Electronically Signed   By: Megan Camacho R Mitchell M.D.   On: 08/15/2015 23:42   I have personally reviewed and evaluated these images and lab results as part of my medical decision-making.   EKG Interpretation None      MDM   Final diagnoses:  None   Patient presents today with pain of the left wrist and hand that has been present since having an IV placed in her hand 2 years ago.  No signs of infection on exam.  Review of the chart shows that she was seen by Vascular Surgery for this same complaint on 09/25/14 and diagnosed with a chronic superficial thrombophlebitis.  Presentation today consistent with this.  Feel that patient is stable for discharge.  Instructed her to take NSAIDs and warm compresses.  Instructed to follow up with Vascular Surgery if symptoms continue.  Return precautions given. I personally performed the services described in this documentation, which was scribed in my presence. The recorded information has been reviewed and is accurate.     Santiago GladHeather Melodye Swor, PA-C 08/16/15 0136  Megan MemosJason Mesner, MD 08/17/15 [phone]

## 2015-08-15 NOTE — ED Notes (Signed)
Patient transported to X-ray 

## 2015-08-15 NOTE — ED Notes (Signed)
See EDP assessment 

## 2015-08-16 MED ORDER — IBUPROFEN 800 MG PO TABS: 800.0000 mg | ORAL_TABLET | Freq: Three times a day (TID) | ORAL | Status: DC

## 2015-08-17 ENCOUNTER — Telehealth: Payer: Self-pay

## 2015-08-17 DIAGNOSIS — M7989 Other specified soft tissue disorders: Secondary | ICD-10-CM

## 2015-08-17 DIAGNOSIS — M79602 Pain in left arm: Secondary | ICD-10-CM

## 2015-08-17 NOTE — Telephone Encounter (Signed)
Discussed with Dr. Hart RochesterLawson.  Recommended to schedule pt. For left UE venous duplex and office appt.  Will contact pt. To schedule.

## 2015-08-17 NOTE — Telephone Encounter (Signed)
sched appt for 08/18/15 lab at 8:00 and NP at 9:15. Spoke to pt to inform her of appt.

## 2015-08-17 NOTE — Telephone Encounter (Signed)
Phone call from pt. Requested appt. With Dr. Imogene Burnhen.  Reported  "having problems with pain in left hand/ arm."  Stated she went to the ER 2 days ago, and had an xray; was advised to take Ibuprofen, warm compresses, and f/u with her vascular specialist.  Stated had onset of pain two days ago, after she had been cleaning.  Reported she began having swelling , pressure, and a red-purple discoloration on her (L) hand, and went to the ER.  Reported has intermittent swelling, pressure, and numbness in left hand.  Today, denied any swelling, discomfort, or discoloration.  Reported has been taking Ibuprofen, as directed by the ER physician, and the symptoms have improved.  Advised will contact pt. with appt. info., after discussing with MD, re: need for any vasc. studies.  Pt. agreed.

## 2015-08-18 ENCOUNTER — Ambulatory Visit: Payer: PPO | Admitting: Family

## 2015-08-18 ENCOUNTER — Encounter (HOSPITAL_COMMUNITY): Payer: PPO

## 2015-11-06 ENCOUNTER — Telehealth: Payer: Self-pay | Admitting: Vascular Surgery

## 2015-11-06 NOTE — Telephone Encounter (Signed)
-----   Message from Sharee PimpleMarilyn K McChesney, RN sent at 11/03/2015  3:07 PM EDT ----- Regarding: Reschedule appt and lab  Patient called in to reschedule appts with lab and Dr. Imogene Burnhen (No showed in April with NP). Left hand pain, seen last year for this problem. Just give her the 1st available, not urgent

## 2015-11-06 NOTE — Telephone Encounter (Signed)
Spoke to pt to sch lab 7/7 at 9 and NP 7/14 at 2:45.

## 2015-11-12 ENCOUNTER — Encounter (HOSPITAL_COMMUNITY): Payer: PPO

## 2015-11-16 ENCOUNTER — Encounter: Payer: Self-pay | Admitting: Family

## 2015-11-18 ENCOUNTER — Ambulatory Visit (HOSPITAL_COMMUNITY)
Admission: RE | Admit: 2015-11-18 | Discharge: 2015-11-18 | Disposition: A | Discharge status: Home / Self Care | Payer: PPO | Source: Ambulatory Visit | Attending: Vascular Surgery | Admitting: Vascular Surgery

## 2015-11-18 DIAGNOSIS — I82712 Chronic embolism and thrombosis of superficial veins of left upper extremity: Secondary | ICD-10-CM | POA: Insufficient documentation

## 2015-11-18 DIAGNOSIS — M79602 Pain in left arm: Secondary | ICD-10-CM | POA: Diagnosis not present

## 2015-11-18 DIAGNOSIS — M7989 Other specified soft tissue disorders: Secondary | ICD-10-CM | POA: Diagnosis not present

## 2015-11-19 ENCOUNTER — Encounter: Payer: Self-pay | Admitting: Family

## 2015-11-19 ENCOUNTER — Ambulatory Visit (INDEPENDENT_AMBULATORY_CARE_PROVIDER_SITE_OTHER): Payer: PPO | Admitting: Family

## 2015-11-19 VITALS — BP 96/61 | HR 74 | Temp 97.4°F | Resp 14 | Ht 60.0 in | Wt 123.0 lb

## 2015-11-19 DIAGNOSIS — M79602 Pain in left arm: Secondary | ICD-10-CM

## 2015-11-19 DIAGNOSIS — I809 Phlebitis and thrombophlebitis of unspecified site: Secondary | ICD-10-CM

## 2015-11-19 NOTE — Progress Notes (Signed)
Established Intermittent Claudication  History of Present Illness  Megan Camacho is a 30 y.o. (05/31/1985) female patient whom Dr. Imogene Burnhen saw on initial consultation in May of 2016.  Pt was hospitalized about 2015 for pregnancy complications and spontaneous abortion. She had an IV placed in her left hand and since then, she has had pain and redness. She states that she does have some pain that radiates into the arm as well. She states that it has not worsened or gotten better. She does have intermittent swelling. She states it hurts at night. She has tried wearing a glove, but this has not helped. She has tried warm compresses as advised by Dr. Imogene Burnhen at her visit in 2016.   She had gestational DM. She does not have any other medical issues.   Past Medical History  Diagnosis Date  . Gestational diabetes mellitus, antepartum   . Gestational diabetes     Social History Social History  Substance Use Topics  . Smoking status: Never Smoker   . Smokeless tobacco: Never Used  . Alcohol Use: No    Family History Family History  Problem Relation Age of Onset  . Diabetes Father     Surgical History Past Surgical History  Procedure Laterality Date  . Cervical cerclage    . Wisdom tooth extraction    . Cesarean section N/A 06/29/2014    Procedure: CESAREAN SECTION;  Surgeon: Robley FriesVaishali R Mody, MD;  Location: [facility];  Service: Obstetrics;  Laterality: N/A;    No Known Allergies  Current Outpatient Prescriptions  Medication Sig Dispense Refill  . ibuprofen (ADVIL,MOTRIN) 800 MG tablet Take 1 tablet (800 mg total) by mouth 3 (three) times daily. (Patient not taking: Reported on 11/19/2015) 21 tablet 0  . iron polysaccharides (NIFEREX) 150 MG capsule Take 1 capsule (150 mg total) by mouth daily. (Patient not taking: Reported on 11/19/2015) 30 capsule 3  . magnesium oxide (MAG-OX) 400 (241.3 MG) MG tablet Reported on 11/19/2015    . Magnesium Oxide 400 (240 MG) MG TABS Take 200 mg by  mouth daily. (Patient not taking: Reported on 11/19/2015) 30 tablet 3  . oxyCODONE-acetaminophen (PERCOCET/ROXICET) 5-325 MG per tablet Take 1 tablet by mouth every 4 (four) hours as needed (for pain scale less than 7). (Patient not taking: Reported on 11/19/2015) 30 tablet 0  . Prenatal Multivit-Min-Fe-FA (PRENATAL VITAMINS PO) Take 1 each by mouth daily. Reported on 11/19/2015     No current facility-administered medications for this visit.    REVIEW OF SYSTEMS: see HPI for pertinent positives and negatives    Physical Examination  Filed Vitals:   11/19/15 1454  BP: 96/61  Pulse: 74  Temp: 97.4 F (36.3 C)  Resp: 14  Height: 5' (1.524 m)  Weight: 123 lb (55.792 kg)  SpO2: 99%   Body mass index is 24.02 kg/(m^2).  General: The patient appears their stated age.   HEENT:  No gross abnormalities Pulmonary: Respirations are non-labored, CTAB Abdomen: Soft and non-tender. Musculoskeletal: There are no major deformities. Motor strength is 5/5 throughout.   Neurologic: No focal weakness or paresthesias are detected, equal sensation in both hands. Skin: There are no ulcer or rashes noted. Psychiatric: The patient has normal affect. Cardiovascular: There is a regular rate and rhythm without significant murmur appreciated. No erythema at her left wrist/hand. Mild tenderness to palpation at her left wrist/hand.  Vascular: Vessel Right Left  Radial 2+Palpable 2+Palpable  Brachial Palpable Palpable  Carotid  without bruit  without bruit  Aorta Not palpable N/A  Popliteal Not palpable Not palpable  PT Palpable Palpable  DP Palpable Palpable    Non-Invasive Vascular Imaging(11/18/15): UPPER EXTREMITY VENOUS DUPLEX EVALUATION    INDICATION: Intermittent pain, swelling, and redness in the left hand and arm.  History of superficial thrombophlebitis of left hand.    PREVIOUS INTERVENTION(S):     DUPLEX EXAM:      Internal Jugular Vein Subclavian Vein Axillary Vein  Brachial Vein    Cephalic Vein  Basilic Vein   Right Left Right Left Right Left Right Left Right Left Right Left  Spontaneous  X  X  X  X  X  X  Phasic  X  X  X  X  X  X  Compressible  X  X  X  X  X  X  Augmentation  X  X  X  X  X  X  Competent  X  X  X  X  X  X     Legend:  X = Yes,  -  = No; P = Partial, D = Decreased, NV = Not Visualized, NA = Not Examined    Thrombosis                                                                    Legend:  A = Acute, C = Chronic, O = Obstructive, P = Partially Obstructive     ADDITIONAL FINDINGS:     IMPRESSION: No evidence of left upper extremity deep vein thrombosis. 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 No significant changes noted when compared to previous exam of 09/25/2014.      Medical Decision Making  Megan Camacho is a 30 y.o. 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 There is no evidence of left upper extremity deep vein thrombosis. No significant changes noted when compared to previous exam of 09/25/2014.  I discussed the pt's HPI, physical exam results, and the venous duplex result above with Dr. Imogene Burn. Superficial venous thrombus does not pose any threat for pulmonary embolus.  The treatment for superficial venous thrombus is local warm compresses and NSAID's. Superficial venous thrombosis usually resolves spontaneously.  I discussed this with the patient and her husband. If there was nerve damage from the IV infiltration from 2015 that is causing pain, then a hand surgeon may have some treatment options.   Based on the patient's vascular studies and examination, I have offered the patient: follow up as needed.Megan Camacho you for allowing [place] to participate in this patient's care.  Megan Camacho, Carma Lair, RN, MSN, FNP-C [facility] Office:  [phone]  11/19/2015, 5:57 PM  Clinic MD: Imogene Burn

## 2016-11-19 ENCOUNTER — Encounter: Payer: Self-pay | Admitting: Emergency Medicine

## 2016-11-19 ENCOUNTER — Emergency Department (HOSPITAL_COMMUNITY)
Admission: EM | Admit: 2016-11-19 | Discharge: 2016-11-19 | Disposition: A | Discharge status: Home / Self Care | Payer: PPO | Attending: Emergency Medicine | Admitting: Emergency Medicine

## 2016-11-19 DIAGNOSIS — Z3200 Encounter for pregnancy test, result unknown: Secondary | ICD-10-CM | POA: Diagnosis not present

## 2016-11-19 DIAGNOSIS — N939 Abnormal uterine and vaginal bleeding, unspecified: Secondary | ICD-10-CM | POA: Diagnosis present

## 2016-11-19 DIAGNOSIS — N946 Dysmenorrhea, unspecified: Secondary | ICD-10-CM | POA: Insufficient documentation

## 2016-11-19 DIAGNOSIS — R1030 Lower abdominal pain, unspecified: Secondary | ICD-10-CM | POA: Diagnosis not present

## 2016-11-19 LAB — WET PREP, GENITAL
Sperm: NONE SEEN
Trich, Wet Prep: NONE SEEN
YEAST WET PREP: NONE SEEN

## 2016-11-19 LAB — I-STAT BETA HCG BLOOD, ED (MC, WL, AP ONLY): I-stat hCG, quantitative: <5 m[IU]/mL (ref <5)

## 2016-11-19 MED ORDER — NAPROXEN 375 MG PO TABS: 375.0000 mg | ORAL_TABLET | Freq: Two times a day (BID) | ORAL | 0 refills | Status: DC

## 2016-11-19 NOTE — ED Triage Notes (Signed)
Onset yesterday pt started having vaginal bleeding.  Today pt has wiped with tissue x 4 and seen light pink to brown blood, and x 2 there has been a spot of blood on underwear.  Mild cramping.  Last intercourse 1 week ago.

## 2016-11-19 NOTE — ED Notes (Signed)
Declined W/C at D/C and was escorted to lobby by RN. 

## 2016-11-19 NOTE — ED Provider Notes (Signed)
[facility]-EMERGENCY DEPT Provider Note   CSN: [id number] Arrival date & time: 11/19/16  1613  By signing my name below, I, Vista Mink, attest that this documentation has been prepared under the direction and in the presence of Kerrie Buffalo NP  Electronically Signed: Vista Mink, ED Scribe. 11/19/16. 5:20 PM.   History   Chief Complaint Chief Complaint  Patient presents with  . Vaginal Bleeding  . Possible Pregnancy    HPI HPI Comments: Megan Camacho is a 31 y.o. female who presents to the Emergency Department complaining of intermittent vaginal bleeding with associated left side suprapubic abdominal pain radiating to left lower back that started yesterday. Pt has had intermittent bleeding over the past 24 hours described as "spotting", but darker blood than her normal period. Pt states that she feels like she's pregnant. She did take an at home pregnancy test but reports that the lines were too faint for a decisive result. Her LNMP was 10/15/16. Pt is G2P1A1, with a stillborn at 27 weeks. No swelling of extremities. No changes in vision. No sore throat, ear ache. No other known medical problems.   The history is provided by the patient. No language interpreter was used.    Past Medical History:  Diagnosis Date  . Gestational diabetes   . Gestational diabetes mellitus, antepartum     Patient Active Problem List   Diagnosis Date Noted  . Placenta abruption, delivered, current hospitalization 06/29/2014  . Postpartum care following cesarean delivery (2/22) 06/29/2014  . Indication for care in labor or delivery 06/28/2014    Past Surgical History:  Procedure Laterality Date  . CERVICAL CERCLAGE    . CESAREAN SECTION N/A 06/29/2014   Procedure: CESAREAN SECTION;  Surgeon: Robley Fries, MD;  Location: [facility];  Service: Obstetrics;  Laterality: N/A;  . WISDOM TOOTH EXTRACTION      OB History    Gravida Para Term Preterm AB Living   2 2 1 1   1    SAB TAB Ectopic Multiple Live  Births         0 1       Home Medications    Prior to Admission medications   Medication Sig Start Date End Date Taking? Authorizing Provider  iron polysaccharides (NIFEREX) 150 MG capsule Take 1 capsule (150 mg total) by mouth daily. Patient not taking: Reported on 11/19/2015 07/02/14   Raelyn Mora, CNM  magnesium oxide (MAG-OX) 400 (241.3 MG) MG tablet Reported on 11/19/2015 07/02/14   [provider]  Magnesium Oxide 400 (240 MG) MG TABS Take 200 mg by mouth daily. Patient not taking: Reported on 11/19/2015 07/02/14   Raelyn Mora, CNM  naproxen (NAPROSYN) 375 MG tablet Take 1 tablet (375 mg total) by mouth 2 (two) times daily. 11/19/16   Janne Napoleon, NP  oxyCODONE-acetaminophen (PERCOCET/ROXICET) 5-325 MG per tablet Take 1 tablet by mouth every 4 (four) hours as needed (for pain scale less than 7). Patient not taking: Reported on 11/19/2015 07/02/14   Raelyn Mora, CNM  Prenatal Multivit-Min-Fe-FA (PRENATAL VITAMINS PO) Take 1 each by mouth daily. Reported on 11/19/2015    [provider]    Family History Family History  Problem Relation Age of Onset  . Diabetes Father     Social History Social History  Substance Use Topics  . Smoking status: Never Smoker  . Smokeless tobacco: Never Used  . Alcohol use No     Allergies   Patient has no known allergies.   Review of Systems  Review of Systems  HENT: Negative for ear pain and sore throat.   Gastrointestinal: Positive for abdominal pain.  Genitourinary: Positive for vaginal bleeding. Negative for vaginal pain.  All other systems reviewed and are negative.    Physical Exam Updated Vital Signs BP 111/78   Pulse 81   Temp 98.1 F (36.7 C) (Oral)   Resp 16   LMP 10/20/2016   SpO2 98%   Physical Exam  Constitutional: She is oriented to person, place, and time. She appears well-developed and well-nourished. No distress.  HENT:  Head: Normocephalic and atraumatic.  Eyes: EOM are normal.    Neck: Normal range of motion.  Cardiovascular: Normal rate and regular rhythm.   Pulmonary/Chest: Effort normal.  Abdominal: Soft. Bowel sounds are normal. There is tenderness.  No CVA tenderness. TTP of the lower abdomen.   Genitourinary:  Genitourinary Comments: External genitalia without lesions, small amount of blood vaginal vault, cervix long, closed, no CMT, no adnexal tenderness or mass palpated. Uterus not enlarged.   Neurological: She is alert and oriented to person, place, and time.  Skin: Skin is warm and dry.  Psychiatric: She has a normal mood and affect. Judgment normal.  Nursing note and vitals reviewed.    ED Treatments / Results  DIAGNOSTIC STUDIES: Oxygen Saturation is 98% on RA, normal by my interpretation.  COORDINATION OF CARE: 5:20 PM-Discussed treatment plan with pt at bedside and pt agreed to plan.   Labs (all labs ordered are listed, but only abnormal results are displayed) Labs Reviewed  WET PREP, GENITAL - Abnormal; Notable for the following:       Result Value   Clue Cells Wet Prep HPF POC FEW (*)    WBC, Wet Prep HPF POC MANY (*)    All other components within normal limits  URINALYSIS, ROUTINE W REFLEX MICROSCOPIC  I-STAT BETA HCG BLOOD, ED ([facility], [facility], [facility] ONLY)  GC/CHLAMYDIA PROBE AMP ([place]) NOT AT [facility]   RadiologyProcedures Procedures (including critical care time)  Medications Ordered in ED Medications - No data to display   Initial Impression / Assessment and Plan / ED Course  I have reviewed the triage vital signs and the nursing notes.  Pertinent lab results that were available during my care of the patient were reviewed by me and considered in my medical decision making (see chart for details).   Final Clinical Impressions(s) / ED Diagnoses  31 y.o. female with vaginal bleeding and cramping stable for d/c with  Bhcg <5 and does not appear toxic. She will f/u with her OB/GYN. [facility]  Final  diagnoses:  Dysmenorrhea    New Prescriptions New Prescriptions   NAPROXEN (NAPROSYN) 375 MG TABLET    Take 1 tablet (375 mg total) by mouth 2 (two) times daily.   I personally performed the services described in this documentation, which was scribed in my presence. The recorded information has been reviewed and is accurate.    Kerrie Buffaloeese, Viyaan Champine EdgertonM, [place]NP 11/19/16 1845    Marily MemosMesner, Jason, MD 11/19/16 2010

## 2016-11-19 NOTE — Discharge Instructions (Signed)
Your blood pregnancy test is negative. Follow up with Dr. Juliene PinaMody. Take the medication as needed for cramping. [facility] symptoms worsen.

## 2016-11-20 LAB — GC/CHLAMYDIA PROBE AMP (~~LOC~~) NOT AT ARMC
CHLAMYDIA, DNA PROBE: NEGATIVE
NEISSERIA GONORRHEA: NEGATIVE

## 2017-12-07 ENCOUNTER — Other Ambulatory Visit (HOSPITAL_COMMUNITY)
Admission: RE | Admit: 2017-12-07 | Discharge: 2017-12-07 | Disposition: A | Discharge status: Home / Self Care | Payer: PPO | Source: Ambulatory Visit | Attending: Obstetrics and Gynecology | Admitting: Obstetrics and Gynecology

## 2017-12-07 ENCOUNTER — Other Ambulatory Visit: Payer: Self-pay | Admitting: Obstetrics and Gynecology

## 2017-12-07 DIAGNOSIS — Z01411 Encounter for gynecological examination (general) (routine) with abnormal findings: Secondary | ICD-10-CM | POA: Insufficient documentation

## 2017-12-11 LAB — CYTOLOGY - PAP
DIAGNOSIS: NEGATIVE
HPV: NOT DETECTED

## 2017-12-25 ENCOUNTER — Other Ambulatory Visit: Payer: Self-pay | Admitting: Obstetrics and Gynecology

## 2018-02-22 LAB — OB RESULTS CONSOLE GC/CHLAMYDIA
Chlamydia: NEGATIVE
Gonorrhea: NEGATIVE

## 2018-02-22 LAB — OB RESULTS CONSOLE RUBELLA ANTIBODY, IGM: Rubella: IMMUNE

## 2018-02-22 LAB — OB RESULTS CONSOLE HEPATITIS B SURFACE ANTIGEN: HEP B S AG: NEGATIVE

## 2018-02-22 LAB — OB RESULTS CONSOLE ANTIBODY SCREEN: Antibody Screen: NEGATIVE

## 2018-02-22 LAB — OB RESULTS CONSOLE ABO/RH: RH TYPE: POSITIVE

## 2018-02-22 LAB — OB RESULTS CONSOLE RPR: RPR: NONREACTIVE

## 2018-02-22 LAB — OB RESULTS CONSOLE HIV ANTIBODY (ROUTINE TESTING): HIV: NONREACTIVE

## 2018-03-04 ENCOUNTER — Inpatient Hospital Stay (HOSPITAL_COMMUNITY)
Admission: AD | Admit: 2018-03-04 | Discharge: 2018-03-04 | Disposition: A | Discharge status: Home / Self Care | Payer: PPO | Source: Ambulatory Visit | Attending: Obstetrics and Gynecology | Admitting: Obstetrics and Gynecology

## 2018-03-04 ENCOUNTER — Encounter (HOSPITAL_COMMUNITY): Payer: Self-pay

## 2018-03-04 DIAGNOSIS — R51 Headache: Secondary | ICD-10-CM | POA: Diagnosis present

## 2018-03-04 DIAGNOSIS — O210 Mild hyperemesis gravidarum: Secondary | ICD-10-CM | POA: Insufficient documentation

## 2018-03-04 DIAGNOSIS — Z3A08 8 weeks gestation of pregnancy: Secondary | ICD-10-CM | POA: Insufficient documentation

## 2018-03-04 DIAGNOSIS — R42 Dizziness and giddiness: Secondary | ICD-10-CM | POA: Diagnosis present

## 2018-03-04 LAB — URINALYSIS, ROUTINE W REFLEX MICROSCOPIC
Bilirubin Urine: NEGATIVE
Glucose, UA: NEGATIVE mg/dL
Hgb urine dipstick: NEGATIVE
KETONES UR: 5 mg/dL — AB
LEUKOCYTES UA: NEGATIVE
NITRITE: NEGATIVE
Protein, ur: NEGATIVE mg/dL
Specific Gravity, Urine: 1.024 (ref 1.005–1.030)
pH: 6 (ref 5.0–8.0)

## 2018-03-04 MED ORDER — PROMETHAZINE HCL 12.5 MG RE SUPP: 12.5000 mg | Freq: Every evening | RECTAL | 2 refills | Status: DC | PRN

## 2018-03-04 MED ORDER — ONDANSETRON 8 MG PO TBDP: 8.0000 mg | ORAL_TABLET | Freq: Three times a day (TID) | ORAL | 1 refills | Status: DC | PRN

## 2018-03-04 MED ORDER — DOCUSATE SODIUM 100 MG PO CAPS: 100.0000 mg | ORAL_CAPSULE | Freq: Two times a day (BID) | ORAL | 0 refills | Status: DC

## 2018-03-04 MED ORDER — ACETAMINOPHEN 325 MG PO TABS
650.0000 mg | ORAL_TABLET | Freq: Once | ORAL | Status: AC
  Administered 2018-03-04: 650 mg via ORAL
  Filled 2018-03-04: qty 2

## 2018-03-04 MED ORDER — ONDANSETRON 8 MG PO TBDP
8.0000 mg | ORAL_TABLET | Freq: Once | ORAL | Status: AC
  Administered 2018-03-04: 8 mg via ORAL
  Filled 2018-03-04: qty 1

## 2018-03-04 NOTE — Discharge Instructions (Signed)
-if not feeling better in 3 days, call her ob to be seen for new prescriptions.  -take phenergan at night (may cause drowsiness) -take colace 2/day for constipation  Eating Plan for Hyperemesis Gravidarum Hyperemesis gravidarum is a severe form of morning sickness. Because this condition causes severe nausea and vomiting, it can lead to dehydration, malnutrition, and weight loss. One way to lessen the symptoms of nausea and vomiting is to follow the eating plan for hyperemesis gravidarum. It is often used along with prescribed medicines to control your symptoms. What can I do to relieve my symptoms? Listen to your body. Everyone is different and has different preferences. Find what works best for you. Take any of the following actions that are helpful to you:  Eat and drink slowly.  Eat 5-6 small meals daily instead of 3 large meals.  Eat crackers before you get out of bed in the morning.  Try having a snack in the middle of the night.  Starchy foods are usually tolerated well. Examples include cereal, toast, bread, potatoes, pasta, rice, and pretzels.  Ginger may help with nausea. Add  tsp ground ginger to hot tea or choose ginger tea.  Try drinking 100% fruit juice or an electrolyte drink. An electrolyte drink contains sodium, potassium, and chloride.  Continue to take your prenatal vitamins as told by your health care provider. If you are having trouble taking your prenatal vitamins, talk with your health care provider about different options.  Include at least 1 serving of protein with your meals and snacks. Protein options include meats or poultry, beans, nuts, eggs, and yogurt. Try eating a protein-rich snack before bed. Examples of these snacks include cheese and crackers or half of a peanut butter or [place] sandwich.  Consider eliminating foods that trigger your symptoms. These may include spicy foods, coffee, high-fat foods, very sweet foods, and acidic foods.  Try meals that  have more protein combined with bland, salty, lower-fat, and dry foods, such as nuts, seeds, pretzels, crackers, and cereal.  Talk with your healthcare provider about starting a supplement of vitamin B6.  Have fluids that are cold, clear, and carbonated or sour. Examples include lemonade, ginger ale, lemon-lime soda, ice water, and sparkling water.  Try lemon or mint tea.  Try brushing your teeth or using a mouth rinse after meals.  What should I avoid to reduce my symptoms? Avoiding some of the following things may help reduce your symptoms.  Foods with strong smells. Try eating meals in well-ventilated areas that are free of odors.  Drinking water or other beverages with meals. Try not to drink anything during the 30 minutes before and after your meals.  Drinking more than 1 cup of fluid at a time. Sometimes using a straw helps.  Fried or high-fat foods, such as butter and cream sauces.  Spicy foods.  Skipping meals as best as you can. Nausea can be more intense on an empty stomach. If you cannot tolerate food at that time, do not force it. Try sucking on ice chips or other frozen items, and make up for missed calories later.  Lying down within 2 hours after eating.  Environmental triggers. These may include smoky rooms, closed spaces, rooms with strong smells, warm or humid places, overly loud and noisy rooms, and rooms with motion or flickering lights.  Quick and sudden changes in your movement.  This information is not intended to replace advice given to you by your health care provider. Make sure you discuss  any questions you have with your health care provider. Document Released: 02/19/2007 Document Revised: 12/22/2015 Document Reviewed: 11/23/2015 Elsevier Interactive Patient Education  [employer].

## 2018-03-04 NOTE — MAU Note (Signed)
Unable to keep anything down all day.  Reports vomiting X4 today.  Also having headache and dizziness.  Tried taking her zofran 4 mg this morning without relief.  No VB/discharge.

## 2018-03-04 NOTE — MAU Provider Note (Addendum)
History    Patient Megan Camacho is a 32 y.o. G3P1101 At [redacted]w[redacted]d here with complaints of headache, dizziness and nausea and vomiting. She denies vaginal bleeding, abnormal discharge, abdominal pain, dysuria, low back pain.  CSN: [id number]  Arrival date and time: 03/04/18 4098   First Provider Initiated Contact with Patient 03/04/18 0217      Chief Complaint  Patient presents with  . Nausea  . Emesis   Emesis   This is a new problem. The current episode started 1 to 4 weeks ago. The problem occurs less than 2 times per day. The problem has been rapidly worsening. There has been no fever. Associated symptoms include dizziness and headaches. Pertinent negatives include no abdominal pain, chills, coughing, diarrhea or fever.  Patient states that she has been able to keep down only one meal per day until today. Today she couldn't keep anything down. She threw up 4 times today. She had been taking zofran once a day; it was working "some days". She usually takes her zofran in the morning at 11; she did not take it today because she did not feel that it was working. She then threw up twice this afternoon, plus once at 6 pm and once at 9 pm.    OB History    Gravida  3   Para  2   Term  1   Preterm  1   AB      Living  1     SAB      TAB      Ectopic      Multiple  0   Live Births  1           Past Medical History:  Diagnosis Date  . Gestational diabetes   . Gestational diabetes mellitus, antepartum     Past Surgical History:  Procedure Laterality Date  . CERVICAL CERCLAGE    . CESAREAN SECTION N/A 06/29/2014   Procedure: CESAREAN SECTION;  Surgeon: Robley Fries, MD;  Location: [facility];  Service: Obstetrics;  Laterality: N/A;  . WISDOM TOOTH EXTRACTION      Family History  Problem Relation Age of Onset  . Diabetes Father     Social History   Tobacco Use  . Smoking status: Never Smoker  . Smokeless tobacco: Never Used  Substance Use Topics  . Alcohol  use: No    Alcohol/week: 0.0 standard drinks  . Drug use: No    Allergies: No Known Allergies  Medications Prior to Admission  Medication Sig Dispense Refill Last Dose  . iron polysaccharides (NIFEREX) 150 MG capsule Take 1 capsule (150 mg total) by mouth daily. (Patient not taking: Reported on 11/19/2015) 30 capsule 3 Not Taking  . magnesium oxide (MAG-OX) 400 (241.3 MG) MG tablet Reported on 11/19/2015   Not Taking  . Magnesium Oxide 400 (240 MG) MG TABS Take 200 mg by mouth daily. (Patient not taking: Reported on 11/19/2015) 30 tablet 3 Not Taking  . naproxen (NAPROSYN) 375 MG tablet Take 1 tablet (375 mg total) by mouth 2 (two) times daily. 20 tablet 0   . oxyCODONE-acetaminophen (PERCOCET/ROXICET) 5-325 MG per tablet Take 1 tablet by mouth every 4 (four) hours as needed (for pain scale less than 7). (Patient not taking: Reported on 11/19/2015) 30 tablet 0 Not Taking  . Prenatal Multivit-Min-Fe-FA (PRENATAL VITAMINS PO) Take 1 each by mouth daily. Reported on 11/19/2015   Not Taking    Review of Systems  Constitutional: Negative for chills and  fever.  Respiratory: Negative for cough.   Gastrointestinal: Positive for vomiting. Negative for abdominal pain and diarrhea.  Neurological: Positive for dizziness and headaches.   Physical Exam   Blood pressure 107/61, pulse 73, temperature 98.1 F (36.7 C), resp. rate 16, height 5' (1.524 m), weight 51 kg, last menstrual period 01/02/2018, currently breastfeeding.  Physical Exam  Constitutional: She is oriented to person, place, and time. She appears well-developed.  HENT:  Head: Normocephalic.  Neck: Normal range of motion.  Cardiovascular: Normal rate.  Respiratory: Effort normal.  GI: Soft.  Musculoskeletal: Normal range of motion.  Neurological: She is alert and oriented to person, place, and time.  Skin: Skin is warm and dry.  Psychiatric: She has a normal mood and affect.    MAU Course  Procedures  MDM -Urine shows only 5 of  ketones, so no IV fluids given -8 mg of ODT zofran and patient is eating ice chips; if she tolerates ice chips will try ginger ale and crackers., Patient now complaining of headache; will given Tylenol for HA (6/10).  -335: Patient HA now 2/10; she tolerated ginger ale and feels well enough to go home.  Patient drove herself her, so phenergan was not able to be given in MAU.  Explained to patient how to take Zofran; she should try to take it twice a day; she should take a stool softener with it. If she feels like she needs to take it 3 times a day for many days, she should contact her ob. Will also add phenergan suppositories at night; patient works during the day so will start with 12.5 mg suppositories.  -Reviewed with patient that we want her to come back if she cannot keep down liquids Assessment and Plan   1. Morning sickness    2. Patient stable for discharge with RX to try Zofran 2-3 times per day (dissolving), colace twice a day, and phenergan at night. She should call her provider if that is not working.   3. Return precautions precautions reviewed; all questions answered.    Charlesetta Garibaldi Kooistra 03/04/2018, 2:17 AM

## 2018-03-28 ENCOUNTER — Telehealth (HOSPITAL_COMMUNITY): Payer: Self-pay | Admitting: *Deleted

## 2018-03-28 NOTE — Telephone Encounter (Signed)
Preadmission screen  

## 2018-04-02 ENCOUNTER — Encounter (HOSPITAL_COMMUNITY): Payer: Self-pay | Admitting: *Deleted

## 2018-04-02 ENCOUNTER — Telehealth (HOSPITAL_COMMUNITY): Payer: Self-pay | Admitting: *Deleted

## 2018-04-02 NOTE — Telephone Encounter (Signed)
Preadmission screen  

## 2018-04-08 ENCOUNTER — Other Ambulatory Visit: Payer: Self-pay | Admitting: Obstetrics and Gynecology

## 2018-04-09 ENCOUNTER — Encounter (HOSPITAL_COMMUNITY): Admission: RE | Admit: 2018-04-09 | Payer: PPO | Source: Ambulatory Visit

## 2018-04-09 ENCOUNTER — Other Ambulatory Visit: Payer: Self-pay | Admitting: Obstetrics and Gynecology

## 2018-04-09 NOTE — Anesthesia Preprocedure Evaluation (Addendum)
Anesthesia Evaluation  Patient identified by MRN, date of birth, ID band Patient awake    Reviewed: Allergy & Precautions, H&P , NPO status , Patient's Chart, lab work & pertinent test results, reviewed documented beta blocker date and time   History of Anesthesia Complications Negative for: history of anesthetic complications  Airway Mallampati: II  TM Distance: >3 FB Neck ROM: full    Dental no notable dental hx. (+) Teeth Intact   Pulmonary neg pulmonary ROS,    Pulmonary exam normal breath sounds clear to auscultation       Cardiovascular Exercise Tolerance: Good negative cardio ROS   Rhythm:regular Rate:Normal     Neuro/Psych negative neurological ROS  negative psych ROS   GI/Hepatic negative GI ROS, Neg liver ROS,   Endo/Other  negative endocrine ROSdiabetes, Gestational  Renal/GU negative Renal ROS  negative genitourinary   Musculoskeletal   Abdominal Normal abdominal exam  (+)   Peds  Hematology negative hematology ROS (+)   Anesthesia Other Findings   Reproductive/Obstetrics negative OB ROS (+) Pregnancy                            Anesthesia Physical  Anesthesia Plan  ASA: II  Anesthesia Plan: Spinal   Post-op Pain Management:    Induction:   PONV Risk Score and Plan: 2 and Treatment may vary due to age or medical condition  Airway Management Planned: Nasal Cannula and Natural Airway  Additional Equipment:   Intra-op Plan:   Post-operative Plan:   Informed Consent: I have reviewed the patients History and Physical, chart, labs and discussed the procedure including the risks, benefits and alternatives for the proposed anesthesia with the patient or authorized representative who has indicated his/her understanding and acceptance.   Dental Advisory Given  Plan Discussed with: CRNA, Anesthesiologist and Surgeon  Anesthesia Plan Comments: (  )        Anesthesia Quick Evaluation

## 2018-04-09 NOTE — H&P (Deleted)
  The note originally documented on this encounter has been moved the the encounter in which it belongs.  

## 2018-04-09 NOTE — H&P (Signed)
Megan Camacho is a 32 y.o. female G3P1101  At 15 wks and 6 days. presenting for cervical cerclage placement due to h/o cervical incompetence with previous pregnancy. She delivered at 25 wks due to cervical incompetence with her first pregnancy. She had a cerclage placed with her last pregnancy and delivered at term via cesarean sectio. . OB History    Gravida  3   Para  2   Term  1   Preterm  1   AB      Living  1     SAB      TAB      Ectopic      Multiple  0   Live Births  1          Past medical history : None Current medications. Zofran and PNV Allergies: None   Past Surgical History:  Procedure Laterality Date  . CERVICAL CERCLAGE    . CESAREAN SECTION N/A 06/29/2014   Procedure: CESAREAN SECTION;  Surgeon: Robley FriesVaishali R Mody, MD;  Location: [facility];  Service: Obstetrics;  Laterality: N/A;  . WISDOM TOOTH EXTRACTION     Family History: family history includes Diabetes in her maternal grandmother. Social History:  reports that she has never smoked. She has never used smokeless tobacco. She reports that she does not drink alcohol or use drugs.    Review of Systems  Constitutional: Negative.   HENT: Negative.   Eyes: Negative.   Respiratory: Negative.   Cardiovascular: Negative.   Gastrointestinal: Negative.   Genitourinary: Negative.   Musculoskeletal: Negative.   Skin: Negative.   Neurological: Negative.   Endo/Heme/Allergies: Negative.   Psychiatric/Behavioral: Negative.    History   Last menstrual period 01/02/2018, currently breastfeeding. Exam Physical Exam  Vitals reviewed. Constitutional: She is oriented to person, place, and time. She appears well-developed and well-nourished.  HENT:  Head: Normocephalic and atraumatic.  Eyes: Pupils are equal, round, and reactive to light. Conjunctivae are normal.  Neck: Normal range of motion. Neck supple.  Cardiovascular: Normal rate and regular rhythm.  Respiratory: Effort normal and breath sounds  normal.  GI: Soft. Bowel sounds are normal.  Genitourinary: Vagina normal.  Musculoskeletal: Normal range of motion. She exhibits no edema.  Neurological: She is alert and oriented to person, place, and time.  Skin: Skin is warm and dry.  Psychiatric: She has a normal mood and affect.    Prenatal labs: ABO, Rh: O/Positive/-- (10/18 0000) Antibody: n (10/18 0000) Rubella: Immune (10/18 0000) RPR: Nonreactive (10/18 0000)  HBsAg: Negative (10/18 0000)  HIV: Non-reactive (10/18 0000)  GBS:     Assessment/Plan: 15 wks and 6 days with  History of cervical incompetence.  Recommend cervical cerclage. R/b/a/ of cerclage was discussed with the patient including but not limited to infection, bleeding, possible rupture of membranes and miscarriage. Pt voiced understanding and desires to proceed   Gerald Leitzara Carlie Solorzano 04/09/2018, 2:47 PM

## 2018-04-10 ENCOUNTER — Encounter (HOSPITAL_COMMUNITY)
Admission: RE | Disposition: A | Discharge status: Home / Self Care | Payer: Self-pay | Source: Ambulatory Visit | Attending: Obstetrics and Gynecology

## 2018-04-10 ENCOUNTER — Inpatient Hospital Stay (HOSPITAL_COMMUNITY): Payer: PPO | Admitting: Anesthesiology

## 2018-04-10 ENCOUNTER — Encounter (HOSPITAL_COMMUNITY): Payer: Self-pay | Admitting: *Deleted

## 2018-04-10 ENCOUNTER — Ambulatory Visit (HOSPITAL_COMMUNITY)
Admission: RE | Admit: 2018-04-10 | Discharge: 2018-04-10 | Disposition: A | Discharge status: Home / Self Care | Payer: PPO | Source: Ambulatory Visit | Attending: Obstetrics and Gynecology | Admitting: Obstetrics and Gynecology

## 2018-04-10 DIAGNOSIS — Z3A15 15 weeks gestation of pregnancy: Secondary | ICD-10-CM | POA: Diagnosis not present

## 2018-04-10 DIAGNOSIS — O3432 Maternal care for cervical incompetence, second trimester: Secondary | ICD-10-CM | POA: Diagnosis present

## 2018-04-10 DIAGNOSIS — O3430 Maternal care for cervical incompetence, unspecified trimester: Secondary | ICD-10-CM | POA: Diagnosis present

## 2018-04-10 HISTORY — PX: CERVICAL CERCLAGE: SHX1329

## 2018-04-10 LAB — CBC
HCT: 36.5 % (ref 36.0–46.0)
Hemoglobin: 12.5 g/dL (ref 12.0–15.0)
MCH: 30.5 pg (ref 26.0–34.0)
MCHC: 34.2 g/dL (ref 30.0–36.0)
MCV: 89 fL (ref 80.0–100.0)
Platelets: 273 10*3/uL (ref 150–400)
RBC: 4.1 MIL/uL (ref 3.87–5.11)
RDW: 13.7 % (ref 11.5–15.5)
WBC: 6.7 10*3/uL (ref 4.0–10.5)
nRBC: 0 % (ref 0.0–0.2)

## 2018-04-10 SURGERY — CERCLAGE, CERVIX, VAGINAL APPROACH
Anesthesia: Spinal

## 2018-04-10 MED ORDER — ACETAMINOPHEN 160 MG/5ML PO SOLN: 325.0000 mg | ORAL | Status: DC | PRN

## 2018-04-10 MED ORDER — OXYCODONE HCL 5 MG/5ML PO SOLN: 5.0000 mg | Freq: Once | ORAL | Status: DC | PRN

## 2018-04-10 MED ORDER — ACETAMINOPHEN 325 MG PO TABS: 325.0000 mg | ORAL_TABLET | ORAL | Status: DC | PRN

## 2018-04-10 MED ORDER — SODIUM CHLORIDE 0.9 % IR SOLN
Status: DC | PRN
  Administered 2018-04-10: 1

## 2018-04-10 MED ORDER — OXYCODONE HCL 5 MG PO TABS: 5.0000 mg | ORAL_TABLET | Freq: Once | ORAL | Status: DC | PRN

## 2018-04-10 MED ORDER — MEPERIDINE HCL 25 MG/ML IJ SOLN: 6.2500 mg | INTRAMUSCULAR | Status: DC | PRN

## 2018-04-10 MED ORDER — FENTANYL CITRATE (PF) 100 MCG/2ML IJ SOLN: 25.0000 ug | INTRAMUSCULAR | Status: DC | PRN

## 2018-04-10 MED ORDER — LACTATED RINGERS IV SOLN
INTRAVENOUS | Status: DC
  Administered 2018-04-10 (×2): via INTRAVENOUS

## 2018-04-10 MED ORDER — BUPIVACAINE IN DEXTROSE 0.75-8.25 % IT SOLN
INTRATHECAL | Status: DC | PRN
  Administered 2018-04-10: 1.4 mL via INTRATHECAL

## 2018-04-10 MED ORDER — INDOMETHACIN 25 MG PO CAPS: 25.0000 mg | ORAL_CAPSULE | Freq: Four times a day (QID) | ORAL | 0 refills | Status: AC

## 2018-04-10 MED ORDER — INDOMETHACIN 50 MG RE SUPP
RECTAL | Status: DC | PRN
  Administered 2018-04-10: 50 mg via RECTAL

## 2018-04-10 MED ORDER — HYDROCODONE-ACETAMINOPHEN 5-325 MG PO TABS: 1.0000 | ORAL_TABLET | ORAL | 0 refills | Status: DC | PRN

## 2018-04-10 MED ORDER — INDOMETHACIN 50 MG RE SUPP
50.0000 mg | Freq: Once | RECTAL | Status: DC
  Filled 2018-04-10: qty 1

## 2018-04-10 MED ORDER — BUPIVACAINE HCL (PF) 0.25 % IJ SOLN
INTRAMUSCULAR | Status: AC
  Filled 2018-04-10: qty 30

## 2018-04-10 MED ORDER — ONDANSETRON HCL 4 MG/2ML IJ SOLN: 4.0000 mg | Freq: Once | INTRAMUSCULAR | Status: DC | PRN

## 2018-04-10 SURGICAL SUPPLY — 16 items
CANISTER SUCT 3000ML PPV (MISCELLANEOUS) ×3 IMPLANT
GLOVE BIOGEL M 6.5 STRL (GLOVE) ×6 IMPLANT
GLOVE BIOGEL PI IND STRL 6.5 (GLOVE) ×1 IMPLANT
GLOVE BIOGEL PI IND STRL 7.0 (GLOVE) ×1 IMPLANT
GLOVE BIOGEL PI INDICATOR 6.5 (GLOVE) ×2
GLOVE BIOGEL PI INDICATOR 7.0 (GLOVE) ×2
GOWN STRL REUS W/TWL LRG LVL3 (GOWN DISPOSABLE) ×6 IMPLANT
PACK VAGINAL MINOR WOMEN LF (CUSTOM PROCEDURE TRAY) ×3 IMPLANT
PAD OB MATERNITY 4.3X12.25 (PERSONAL CARE ITEMS) ×3 IMPLANT
PAD PREP 24X48 CUFFED NSTRL (MISCELLANEOUS) ×3 IMPLANT
SUT PROLENE 1 CT 1 30 (SUTURE) ×6 IMPLANT
TOWEL OR 17X24 6PK STRL BLUE (TOWEL DISPOSABLE) ×6 IMPLANT
TRAY FOLEY W/BAG SLVR 14FR (SET/KITS/TRAYS/PACK) ×3 IMPLANT
TUBING NON-CON 1/4 X 20 CONN (TUBING) IMPLANT
TUBING NON-CON 1/4 X 20' CONN (TUBING)
YANKAUER SUCT BULB TIP NO VENT (SUCTIONS) ×3 IMPLANT

## 2018-04-10 NOTE — H&P (Signed)
Date of Initial H&P: `04/09/2018 History reviewed, patient examined, no change in status, stable for surgery. 

## 2018-04-10 NOTE — Transfer of Care (Signed)
Immediate Anesthesia Transfer of Care Note  Patient: Megan Camacho  Procedure(s) Performed: CERCLAGE CERVICAL (N/A )  Patient Location: PACU and OB High Risk  Anesthesia Type:Spinal  Level of Consciousness: awake  Airway & Oxygen Therapy: Patient Spontanous Breathing  Post-op Assessment: Report given to RN  Post vital signs: Reviewed and stable  Last Vitals:  Vitals Value Taken Time  BP    Temp    Pulse    Resp    SpO2      Last Pain:  Vitals:   04/10/18 0642  TempSrc: Oral  PainSc: 0-No pain         Complications: No apparent anesthesia complications

## 2018-04-10 NOTE — Anesthesia Procedure Notes (Addendum)
Spinal  Patient location during procedure: OR Start time: 04/10/2018 7:54 AM End time: 04/10/2018 7:58 AM Staffing Anesthesiologist: Bethena Midgetddono, Yesenia Fontenette, MD Preanesthetic Checklist Completed: patient identified, site marked, surgical consent, pre-op evaluation, timeout performed, IV checked, risks and benefits discussed and monitors and equipment checked Spinal Block Patient position: sitting Prep: DuraPrep Patient monitoring: heart rate, cardiac monitor, continuous pulse ox and blood pressure Approach: midline Location: L3-4 Injection technique: single-shot Needle Needle type: Sprotte  Needle gauge: 24 G Needle length: 9 cm Assessment Sensory level: T4

## 2018-04-10 NOTE — Anesthesia Postprocedure Evaluation (Signed)
Anesthesia Post Note  Patient: Mahala Raby  Procedure(s) Performed: CERCLAGE CERVICAL (N/A )     Patient location during evaluation: PACU Anesthesia Type: Spinal Level of consciousness: oriented and awake and alert Pain management: pain level controlled Vital Signs Assessment: post-procedure vital signs reviewed and stable Respiratory status: spontaneous breathing, respiratory function stable and patient connected to nasal cannula oxygen Cardiovascular status: blood pressure returned to baseline and stable Postop Assessment: no headache, no backache and no apparent nausea or vomiting Anesthetic complications: no    Last Vitals:  Vitals:   04/10/18 1045 04/10/18 1100  BP: 108/63 101/64  Pulse: 82 81  Resp: 20 18  Temp:    SpO2: 100% 100%    Last Pain:  Vitals:   04/10/18 0930  TempSrc: Oral  PainSc:    Pain Goal:                 Oliver Heitzenrater

## 2018-04-10 NOTE — Progress Notes (Signed)
Discharge instructions reviewed with patient and patient stated understanding. Patient ambulatory, stable, left with husband.

## 2018-04-10 NOTE — Op Note (Signed)
04/10/2018  8:39 AM  PATIENT:  Megan Camacho  32 y.o. female  PRE-OPERATIVE DIAGNOSIS:  O34.31 Cervical incompetence affection management of pregnancy  POST-OPERATIVE DIAGNOSIS:  O34.31 Cervical incompetence affection management of pregnancy  PROCEDURE:  Procedure(s): CERCLAGE CERVICAL (N/A)  SURGEON:  Surgeon(s) and Role:    Gerald Leitz* Erasmo Vertz, MD - Primary  PHYSICIAN ASSISTANT:   ASSISTANTS: none   ANESTHESIA:   spinal  EBL:  5 mL   BLOOD ADMINISTERED:none  DRAINS: Urinary Catheter (Foley)   LOCAL MEDICATIONS USED:  NONE  SPECIMEN:  No Specimen  DISPOSITION OF SPECIMEN:  N/A  COUNTS:  YES  TOURNIQUET:  * No tourniquets in log *  DICTATION: .Dragon Dictation  PLAN OF CARE: Discharge to home after PACU  PATIENT DISPOSITION:  PACU - hemodynamically stable.   Delay start of Pharmacological VTE agent (>24hrs) due to surgical blood loss or risk of bleeding: not applicable   Procedure.. Pt was taken to operating room #2. A time out was performed. She was identified as [name] . Spinal anesthestic was placed and found to be adequate. She was placed in the dorsal lithotomy position and prepped and draped in the usual sterile fashion. Foley catheter was placed.  The cervix was grasped anteriorly with ring forceps. A weighted speculum was placed in the vaginal vault.  A macdonald cerclage was placed using 1-0 prolene. The suture was placed in a purse string fashion and tied at the 12 oclock position. Excellent hemostasis was noted. Sponge lap and needle counts were correct times two. Patient was transferred to the recovery room in stable condition.

## 2018-08-30 ENCOUNTER — Other Ambulatory Visit: Payer: Self-pay

## 2018-08-30 ENCOUNTER — Encounter (HOSPITAL_COMMUNITY): Payer: Self-pay | Admitting: Emergency Medicine

## 2018-08-30 ENCOUNTER — Emergency Department (HOSPITAL_COMMUNITY)
Admission: EM | Admit: 2018-08-30 | Discharge: 2018-08-30 | Disposition: A | Discharge status: Home / Self Care | Payer: PPO | Attending: Emergency Medicine | Admitting: Emergency Medicine

## 2018-08-30 DIAGNOSIS — Z3483 Encounter for supervision of other normal pregnancy, third trimester: Secondary | ICD-10-CM | POA: Diagnosis not present

## 2018-08-30 DIAGNOSIS — Z79899 Other long term (current) drug therapy: Secondary | ICD-10-CM | POA: Diagnosis not present

## 2018-08-30 DIAGNOSIS — R05 Cough: Secondary | ICD-10-CM | POA: Insufficient documentation

## 2018-08-30 DIAGNOSIS — R059 Cough, unspecified: Secondary | ICD-10-CM

## 2018-08-30 MED ORDER — DM-GUAIFENESIN ER 60-1200 MG PO TB12: 1.0000 | ORAL_TABLET | Freq: Two times a day (BID) | ORAL | 0 refills | Status: DC | PRN

## 2018-08-30 MED ORDER — ALBUTEROL SULFATE HFA 108 (90 BASE) MCG/ACT IN AERS
2.0000 | INHALATION_SPRAY | RESPIRATORY_TRACT | Status: DC | PRN
  Administered 2018-08-30: 2 via RESPIRATORY_TRACT

## 2018-08-30 NOTE — ED Triage Notes (Signed)
Pt c/o cough x 5 days denies fever or other symptoms

## 2018-08-30 NOTE — ED Provider Notes (Addendum)
[facility]-EMERGENCY DEPT Provider Note: Megan DellJ. Lane Christinia Lambeth, MD, FACEP  CSN: [id number] MRN: [id number] ARRIVAL: 08/30/18 at 0517 ROOM: WA15/WA15   CHIEF COMPLAINT  Cough   HISTORY OF PRESENT ILLNESS  08/30/18 5:32 AM Megan Rush Landmarklyami is a 33 y.o. female who is [redacted] weeks pregnant.  She is here with 5 days of cough.  The cough is occasionally productive.  It is worse when lying supine.  Symptoms have been moderate but worsening.  She denies fever, nasal congestion, anosmia, ageusia, sore throat, shortness of breath, wheezing, nausea, vomiting, diarrhea and dysuria.  She is having some mild left lower quadrant pain.   Past Medical History:  Diagnosis Date  . Gestational diabetes mellitus, antepartum     Past Surgical History:  Procedure Laterality Date  . CERVICAL CERCLAGE    . CERVICAL CERCLAGE N/A 04/10/2018   Procedure: CERCLAGE CERVICAL;  Surgeon: Gerald Leitzole, Tara, MD;  [facility];  Service: Gynecology;  Laterality: N/A;  . CESAREAN SECTION N/A 06/29/2014   Procedure: CESAREAN SECTION;  Surgeon: Robley FriesVaishali R Mody, MD;  Location: [facility];  Service: Obstetrics;  Laterality: N/A;  . WISDOM TOOTH EXTRACTION      Family History  Problem Relation Age of Onset  . Diabetes Maternal Grandmother     Social History   Tobacco Use  . Smoking status: Never Smoker  . Smokeless tobacco: Never Used  Substance Use Topics  . Alcohol use: No    Alcohol/week: 0.0 standard drinks  . Drug use: No    Prior to Admission medications   Medication Sig Start Date End Date Taking? Authorizing Provider  Dextromethorphan-Guaifenesin 60-1200 MG 12hr tablet Take 1 tablet by mouth every 12 (twelve) hours as needed (for cough). 08/30/18   Tywon Niday, MD  HYDROcodone-acetaminophen (NORCO) 5-325 MG tablet Take 1 tablet by mouth every 4 (four) hours as needed for moderate pain. 04/10/18   Gerald Leitzole, Tara, MD  ondansetron (ZOFRAN-ODT) 8 MG disintegrating tablet Take 1 tablet (8 mg total) by mouth every 8 (eight)  hours as needed for nausea or vomiting. Patient taking differently: Take 8 mg by mouth daily.  03/04/18   Marylene LandKooistra, Kathryn Lorraine, CNM  Prenatal Multivit-Min-Fe-FA (PRENATAL VITAMINS PO) Take 1 tablet by mouth at bedtime. Reported on 11/19/2015    [provider]    Allergies Patient has no known allergies.   REVIEW OF SYSTEMS  Negative except as noted here or in the History of Present Illness.   PHYSICAL EXAMINATION  Initial Vital Signs Blood pressure 116/78, pulse (!) 114, temperature 98.1 F (36.7 C), temperature source Oral, resp. rate 16, last menstrual period 01/02/2018, SpO2 98 %, currently breastfeeding.  Examination General: Well-developed, well-nourished female in no acute distress; appearance consistent with age of record HENT: normocephalic; atraumatic; no dysphonia Eyes: Normal appearance Neck: supple Heart: regular rate and rhythm; no murmurs, rubs or gallops Lungs: clear to auscultation bilaterally Abdomen: soft; gravid, consistent with dates; mild left lower quadrant tenderness; bowel sounds present Extremities: No deformity; full range of motion Neurologic: Awake, alert and oriented; motor function intact in all extremities and symmetric; no facial droop Skin: Warm and dry Psychiatric: Normal mood and affect   RESULTS  Summary of this visit's results, reviewed by myself:   EKG Interpretation  Date/Time:    Ventricular Rate:    PR Interval:    QRS Duration:   QT Interval:    QTC Calculation:   R Axis:     Text Interpretation:        Laboratory  Studies: No results found for this or any previous visit (from the past 24 hour(s)). Imaging Studies: No results found.  ED COURSE and MDM  Nursing notes and initial vitals signs, including pulse oximetry, reviewed.  Vitals:   08/30/18 0528  BP: 116/78  Pulse: (!) 114  Resp: 16  Temp: 98.1 F (36.7 C)  TempSrc: Oral  SpO2: 98%    PROCEDURES    ED DIAGNOSES     ICD-10-CM   1.  Cough R05        Brandley Aldrete, Jonny Ruiz, MD 08/30/18 0537    Paula Libra, MD 08/30/18 [phone]

## 2018-09-02 ENCOUNTER — Emergency Department (HOSPITAL_COMMUNITY)
Admission: AD | Admit: 2018-09-02 | Discharge: 2018-09-02 | Disposition: A | Discharge status: Home / Self Care | Payer: PPO | Attending: Emergency Medicine | Admitting: Emergency Medicine

## 2018-09-02 ENCOUNTER — Encounter (HOSPITAL_COMMUNITY): Payer: Self-pay | Admitting: Emergency Medicine

## 2018-09-02 ENCOUNTER — Other Ambulatory Visit: Payer: Self-pay

## 2018-09-02 ENCOUNTER — Emergency Department (HOSPITAL_COMMUNITY): Payer: PPO

## 2018-09-02 DIAGNOSIS — Z3A36 36 weeks gestation of pregnancy: Secondary | ICD-10-CM | POA: Insufficient documentation

## 2018-09-02 DIAGNOSIS — O2693 Pregnancy related conditions, unspecified, third trimester: Secondary | ICD-10-CM | POA: Diagnosis present

## 2018-09-02 DIAGNOSIS — R062 Wheezing: Secondary | ICD-10-CM | POA: Insufficient documentation

## 2018-09-02 DIAGNOSIS — O26893 Other specified pregnancy related conditions, third trimester: Secondary | ICD-10-CM

## 2018-09-02 DIAGNOSIS — R05 Cough: Secondary | ICD-10-CM | POA: Diagnosis not present

## 2018-09-02 DIAGNOSIS — R059 Cough, unspecified: Secondary | ICD-10-CM

## 2018-09-02 MED ORDER — ALBUTEROL SULFATE HFA 108 (90 BASE) MCG/ACT IN AERS
1.0000 | INHALATION_SPRAY | Freq: Once | RESPIRATORY_TRACT | Status: AC
  Administered 2018-09-02: 07:00:00 1 via RESPIRATORY_TRACT
  Filled 2018-09-02: qty 6.7

## 2018-09-02 NOTE — MAU Note (Signed)
Patient reports a cough for the past 8 days.  No SOB.  No fevers.  No sick contacts or traveling.  No contractions/LOF/VB.  + FM.  No OB complaints.

## 2018-09-02 NOTE — Discharge Instructions (Signed)
Please read and follow all provided instructions.  Your diagnoses today include:  1. Cough   2. [redacted] weeks gestation of pregnancy     You appear to have an upper respiratory infection (URI). An upper respiratory tract infection, or cold, is a viral infection of the air passages leading to the lungs. It should improve gradually after 5-7 days. You may have a lingering cough that lasts for 2- 4 weeks after the infection.  Tests performed today include: Vital signs. See below for your results today.   Medications prescribed:   Take any prescribed medications only as directed. Treatment for your infection is aimed at treating the symptoms. There are no medications, such as antibiotics, that will cure your infection.   Please try taking albuterol, 2 puffs particularly at bedtime.  You may use this every 4-6 hours as needed for cough.   Home care instructions:  Follow any educational materials contained in this packet.   Your illness is contagious and can be spread to others, especially during the first 3 or 4 days. It cannot be cured by antibiotics or other medicines. Take basic precautions such as washing your hands often, covering your mouth when you cough or sneeze, and avoiding public places where you could spread your illness to others.   Please continue drinking plenty of fluids.  Use over-the-counter medicines as needed as directed on packaging for symptom relief.  You may also use ibuprofen or tylenol as directed on packaging for pain or fever.  Do not take multiple medicines containing Tylenol or acetaminophen to avoid taking too much of this medication.  Follow-up instructions: Please follow-up with your primary care provider in the next 3 days for further evaluation of your symptoms if you are not feeling better.   Return instructions:  Please return to the Emergency Department if you experience worsening symptoms.  RETURN IMMEDIATELY IF you develop shortness of breath, chest pain,  confusion or altered mental status, a new rash, become dizzy, faint, or poorly responsive, or are unable to be cared for at home. Please return if you have persistent vomiting and cannot keep down fluids or develop a fever that is not controlled by tylenol. Please return if you have any other emergent concerns.  Additional Information:  Your vital signs today were: BP 107/68    Pulse 90    Temp 98.1 F (36.7 C)    Resp 16    Ht 5' (1.524 m)    Wt 63.5 kg    LMP 01/02/2018    SpO2 99%    BMI 27.34 kg/m  If your blood pressure (BP) was elevated above 135/85 this visit, please have this repeated by your doctor within one month. --------------

## 2018-09-02 NOTE — ED Triage Notes (Signed)
Pt. From home c/o cough that has not improved since last being seen 3 days ago at [facility].  Pt. Taking mucus DM extended release.   Pt. Denies fever and other symptoms.  Pt. Currently [redacted] weeks pregnant.

## 2018-09-02 NOTE — ED Provider Notes (Signed)
[facility] Provider Note   CSN: [id number] Arrival date & time: 09/02/18  0341    History   Chief Complaint Chief Complaint  Patient presents with  . Cough    HPI Domingo PulseSahar Cieslik is a 33 y.o. female.     HPI  Patient is a 33 year old female currently [redacted] weeks pregnant with no significant past medical history presenting for cough.  Patient ports she has had approximately 1 week of cough.  She reports that when it initially began she had some upper respiratory symptoms of rhinorrhea and sore throat however these have resolved and the cough is continued.  She reports she overall feels well during the day but the cough keeps her up at night.  Patient reports that she is tried home remedies including tea, honey, and most recently over the past 3 days guaifenesin-dextromethorphan.  Patient denies any fevers, chills, shortness of breath or chest pain.  Cough is mostly nonproductive but she does produce some white sputum.  She has not recently traveled nor has she been in any close contact with an individual that has tested positive for COVID-19.  She does not smoke and she is not around any passive smoke exposure.  History reviewed. No pertinent past medical history.  Patient Active Problem List   Diagnosis Date Noted  . Cervical incompetence affecting management of mother, antepartum 04/10/2018  . Placenta abruption, delivered, current hospitalization 06/29/2014  . Postpartum care following cesarean delivery (2/22) 06/29/2014  . Indication for care in labor or delivery 06/28/2014    Past Surgical History:  Procedure Laterality Date  . CERVICAL CERCLAGE    . CERVICAL CERCLAGE N/A 04/10/2018   Procedure: CERCLAGE CERVICAL;  Surgeon: Gerald Leitzole, Tara, MD;  [facility];  Service: Gynecology;  Laterality: N/A;  . CESAREAN SECTION N/A 06/29/2014   Procedure: CESAREAN SECTION;  Surgeon: Robley FriesVaishali R Mody, MD;  Location: [facility];  Service:  Obstetrics;  Laterality: N/A;  . WISDOM TOOTH EXTRACTION       OB History    Gravida  3   Para  2   Term  1   Preterm  1   AB      Living  1     SAB      TAB      Ectopic      Multiple  0   Live Births  1            Home Medications    Prior to Admission medications   Medication Sig Start Date End Date Taking? Authorizing Provider  Dextromethorphan-Guaifenesin 60-1200 MG 12hr tablet Take 1 tablet by mouth every 12 (twelve) hours as needed (for cough). 08/30/18   Molpus, John, MD  HYDROcodone-acetaminophen (NORCO) 5-325 MG tablet Take 1 tablet by mouth every 4 (four) hours as needed for moderate pain. 04/10/18   Gerald Leitzole, Tara, MD  ondansetron (ZOFRAN-ODT) 8 MG disintegrating tablet Take 1 tablet (8 mg total) by mouth every 8 (eight) hours as needed for nausea or vomiting. Patient taking differently: Take 8 mg by mouth daily.  03/04/18   Marylene LandKooistra, Kathryn Lorraine, CNM  Prenatal Multivit-Min-Fe-FA (PRENATAL VITAMINS PO) Take 1 tablet by mouth at bedtime. Reported on 11/19/2015    [provider]    Family History Family History  Problem Relation Age of Onset  . Diabetes Maternal Grandmother     Social History Social History   Tobacco Use  . Smoking status: Never Smoker  . Smokeless tobacco: Never Used  Substance Use Topics  . Alcohol use: No    Alcohol/week: 0.0 standard drinks  . Drug use: No     Allergies   Patient has no known allergies.   Review of Systems Review of Systems  Constitutional: Negative for chills and fever.  HENT: Negative for congestion, rhinorrhea, sinus pain and sore throat.   Eyes: Negative for visual disturbance.  Respiratory: Positive for cough. Negative for chest tightness and shortness of breath.   Cardiovascular: Negative for chest pain, palpitations and leg swelling.  Gastrointestinal: Negative for abdominal pain, nausea and vomiting.  Genitourinary: Negative for dysuria and flank pain.  Musculoskeletal:  Negative for back pain and myalgias.  Skin: Negative for rash.  Neurological: Negative for dizziness, syncope, light-headedness and headaches.     Physical Exam Updated Vital Signs BP 107/68   Pulse 90   Temp 98.1 F (36.7 C)   Resp 16   Ht 5' (1.524 m)   Wt 63.5 kg   LMP 01/02/2018   SpO2 99%   BMI 27.34 kg/m   Physical Exam Vitals signs and nursing note reviewed.  Constitutional:      General: She is not in acute distress.    Appearance: She is well-developed.  HENT:     Head: Normocephalic and atraumatic.  Eyes:     Conjunctiva/sclera: Conjunctivae normal.     Pupils: Pupils are equal, round, and reactive to light.  Neck:     Musculoskeletal: Normal range of motion and neck supple.  Cardiovascular:     Rate and Rhythm: Normal rate and regular rhythm.     Heart sounds: S1 normal and S2 normal. No murmur.  Pulmonary:     Effort: Pulmonary effort is normal.     Breath sounds: Wheezing present. No rales.     Comments: Very soft left-sided inspiratory wheeze. Abdominal:     General: There is no distension.     Palpations: Abdomen is soft.     Tenderness: There is no abdominal tenderness. There is no guarding.     Comments: Gravid abdomen.  Musculoskeletal: Normal range of motion.        General: No deformity.  Lymphadenopathy:     Cervical: No cervical adenopathy.  Skin:    General: Skin is warm and dry.     Findings: No erythema or rash.  Neurological:     Mental Status: She is alert.     Comments: Cranial nerves grossly intact. Patient moves extremities symmetrically and with good coordination.  Psychiatric:        Behavior: Behavior normal.        Thought Content: Thought content normal.        Judgment: Judgment normal.      ED Treatments / Results  Labs (all labs ordered are listed, but only abnormal results are displayed) Labs Reviewed - No data to display  EKG None  Radiology Dg Chest Portable 1 View  Result Date: 09/02/2018 CLINICAL  DATA:  Cough. EXAM: PORTABLE CHEST 1 VIEW COMPARISON:  None. FINDINGS: The heart size is normal. Lung volumes are low. No focal airspace disease is present. There is no edema or effusion. The visualized soft tissues and bony thorax are unremarkable. IMPRESSION: 1. Low lung volumes. 2. No acute cardiopulmonary disease. Electronically Signed   By: Marin Roberts M.D.   On: 09/02/2018 06:12    Procedures Procedures (including critical care time)  Medications Ordered in ED Medications  albuterol (VENTOLIN HFA) 108 (90 Base) MCG/ACT inhaler 1 puff (has no  administration in time range)     Initial Impression / Assessment and Plan / ED Course  I have reviewed the triage vital signs and the nursing notes.  Pertinent labs & imaging results that were available during my care of the patient were reviewed by me and considered in my medical decision making (see chart for details).  Clinical Course as of Sep 01 648  Mon Sep 02, 2018  0641 Spoke with pharmacist regarding use of albuterol and states that it is safe in pregnancy. Appreciate her involvement.    [AM]    Clinical Course User Index [AM] Elisha Ponder, PA-C       Patient is nontoxic-appearing, afebrile, and hemodynamically stable.  Differential diagnosis includes bronchitis, postnasal drip, allergic rhinitis, pneumonia.  She is very well-appearing and does not have any shortness of breath.  Her main complaint is nighttime cough.  Given the soft inspiratory wheeze on exam, and the time course of her symptoms, suspect bronchitis.  Chest x-ray performed and is negative for infiltrate suggestive of pneumonia.  Will have patient continue guaifenesin/dextromethorphan, and start PRN albuterol.  Patient is instructed return to the emergency department she experiences any shortness of breath, chest pain, or fevers with her cough.  Patient is in understanding and agrees with the plan of care.  Final Clinical Impressions(s) / ED Diagnoses    Final diagnoses:  Cough  [redacted] weeks gestation of pregnancy    ED Discharge Orders    None       Delia Chimes 09/02/18 1219    Marily Memos, MD 09/06/18 2325

## 2018-09-02 NOTE — ED Notes (Signed)
Discharge instructions and medications discussed with pt. Pt. Verbalized understanding. Pt has no questions at this time.

## 2018-09-02 NOTE — MAU Provider Note (Signed)
[name] is a 33 y.o. female G3P1101 @ [redacted]w[redacted]d here with cough. She was seen at the [facility] ED on 4/24 for a cough and was given cough medication which is not helping. She says her cough has gotten worse despite the cough medications. No SOB, no fevers. No recent travel.  Blood pressure 113/66, pulse (!) 106, temperature 98.3 F (36.8 C), resp. rate 19, last menstrual period 01/02/2018, SpO2 99 %, currently breastfeeding.  Discussed patient with Dr. Bebe Shaggy at [facility] ED. Ok for transfer to [facility] ED for further evaluation. Patient to be escorted by MAU staff.   Duane Lope, NP 09/02/2018 4:37 AM

## 2018-09-12 LAB — OB RESULTS CONSOLE GBS: GBS: NEGATIVE

## 2018-09-25 ENCOUNTER — Telehealth (HOSPITAL_COMMUNITY): Payer: Self-pay | Admitting: *Deleted

## 2018-09-26 ENCOUNTER — Encounter (HOSPITAL_COMMUNITY): Payer: Self-pay | Admitting: *Deleted

## 2018-09-26 NOTE — Telephone Encounter (Signed)
Preadmission screen  

## 2018-10-01 ENCOUNTER — Other Ambulatory Visit (HOSPITAL_COMMUNITY)
Admission: RE | Admit: 2018-10-01 | Discharge: 2018-10-01 | Disposition: A | Discharge status: Home / Self Care | Payer: PPO | Source: Ambulatory Visit | Attending: Obstetrics and Gynecology | Admitting: Obstetrics and Gynecology

## 2018-10-01 ENCOUNTER — Other Ambulatory Visit: Payer: Self-pay

## 2018-10-01 DIAGNOSIS — Z1159 Encounter for screening for other viral diseases: Secondary | ICD-10-CM | POA: Insufficient documentation

## 2018-10-01 NOTE — MAU Note (Signed)
Asymptomatic. Swab collected with some difficulty as pt kept reaching for hand and turning head.  Was able to collect.

## 2018-10-02 ENCOUNTER — Inpatient Hospital Stay (HOSPITAL_COMMUNITY): Payer: PPO | Admitting: Anesthesiology

## 2018-10-02 ENCOUNTER — Other Ambulatory Visit: Payer: Self-pay

## 2018-10-02 ENCOUNTER — Inpatient Hospital Stay (HOSPITAL_COMMUNITY)
Admission: AD | Admit: 2018-10-02 | Discharge: 2018-10-05 | DRG: 788 | Disposition: A | Discharge status: Home / Self Care | Payer: PPO | Attending: Obstetrics and Gynecology | Admitting: Obstetrics and Gynecology

## 2018-10-02 ENCOUNTER — Encounter (HOSPITAL_COMMUNITY): Payer: Self-pay

## 2018-10-02 ENCOUNTER — Other Ambulatory Visit: Payer: Self-pay | Admitting: Obstetrics and Gynecology

## 2018-10-02 DIAGNOSIS — Z3A40 40 weeks gestation of pregnancy: Secondary | ICD-10-CM | POA: Diagnosis not present

## 2018-10-02 DIAGNOSIS — O34219 Maternal care for unspecified type scar from previous cesarean delivery: Secondary | ICD-10-CM | POA: Diagnosis present

## 2018-10-02 DIAGNOSIS — O480 Post-term pregnancy: Principal | ICD-10-CM | POA: Diagnosis present

## 2018-10-02 LAB — CBC
HCT: 34.8 % — ABNORMAL LOW (ref 36.0–46.0)
Hemoglobin: 11.3 g/dL — ABNORMAL LOW (ref 12.0–15.0)
MCH: 27.5 pg (ref 26.0–34.0)
MCHC: 32.5 g/dL (ref 30.0–36.0)
MCV: 84.7 fL (ref 80.0–100.0)
Platelets: 227 10*3/uL (ref 150–400)
RBC: 4.11 MIL/uL (ref 3.87–5.11)
RDW: 17.2 % — ABNORMAL HIGH (ref 11.5–15.5)
WBC: 6.3 10*3/uL (ref 4.0–10.5)
nRBC: 0 % (ref 0.0–0.2)

## 2018-10-02 LAB — NOVEL CORONAVIRUS, NAA (HOSP ORDER, SEND-OUT TO REF LAB; TAT 18-24 HRS): SARS-CoV-2, NAA: NOT DETECTED

## 2018-10-02 LAB — TYPE AND SCREEN
ABO/RH(D): O POS
Antibody Screen: NEGATIVE

## 2018-10-02 MED ORDER — PHENYLEPHRINE 40 MCG/ML (10ML) SYRINGE FOR IV PUSH (FOR BLOOD PRESSURE SUPPORT)
80.0000 ug | PREFILLED_SYRINGE | INTRAVENOUS | Status: DC | PRN
  Administered 2018-10-03 (×2): 80 ug via INTRAVENOUS

## 2018-10-02 MED ORDER — ACETAMINOPHEN 325 MG PO TABS: 650.0000 mg | ORAL_TABLET | ORAL | Status: DC | PRN

## 2018-10-02 MED ORDER — LACTATED RINGERS IV SOLN: 500.0000 mL | Freq: Once | INTRAVENOUS | Status: DC

## 2018-10-02 MED ORDER — LIDOCAINE HCL (PF) 1 % IJ SOLN: 30.0000 mL | INTRAMUSCULAR | Status: DC | PRN

## 2018-10-02 MED ORDER — OXYCODONE-ACETAMINOPHEN 5-325 MG PO TABS: 1.0000 | ORAL_TABLET | ORAL | Status: DC | PRN

## 2018-10-02 MED ORDER — ONDANSETRON HCL 4 MG/2ML IJ SOLN: 4.0000 mg | Freq: Four times a day (QID) | INTRAMUSCULAR | Status: DC | PRN

## 2018-10-02 MED ORDER — FENTANYL-BUPIVACAINE-NACL 0.5-0.125-0.9 MG/250ML-% EP SOLN
12.0000 mL/h | EPIDURAL | Status: DC | PRN
  Filled 2018-10-02: qty 250

## 2018-10-02 MED ORDER — TERBUTALINE SULFATE 1 MG/ML IJ SOLN: 0.2500 mg | Freq: Once | INTRAMUSCULAR | Status: DC | PRN

## 2018-10-02 MED ORDER — OXYTOCIN 40 UNITS IN NORMAL SALINE INFUSION - SIMPLE MED
1.0000 m[IU]/min | INTRAVENOUS | Status: DC
  Administered 2018-10-02: 14:00:00 2 m[IU]/min via INTRAVENOUS
  Filled 2018-10-02: qty 1000

## 2018-10-02 MED ORDER — EPHEDRINE 5 MG/ML INJ: 10.0000 mg | INTRAVENOUS | Status: DC | PRN

## 2018-10-02 MED ORDER — FENTANYL CITRATE (PF) 100 MCG/2ML IJ SOLN
50.0000 ug | INTRAMUSCULAR | Status: DC | PRN
  Administered 2018-10-02: 19:00:00 50 ug via INTRAVENOUS
  Filled 2018-10-02 (×2): qty 2

## 2018-10-02 MED ORDER — DIPHENHYDRAMINE HCL 50 MG/ML IJ SOLN: 12.5000 mg | INTRAMUSCULAR | Status: DC | PRN

## 2018-10-02 MED ORDER — OXYTOCIN 40 UNITS IN NORMAL SALINE INFUSION - SIMPLE MED: 2.5000 [IU]/h | INTRAVENOUS | Status: DC

## 2018-10-02 MED ORDER — OXYCODONE-ACETAMINOPHEN 5-325 MG PO TABS: 2.0000 | ORAL_TABLET | ORAL | Status: DC | PRN

## 2018-10-02 MED ORDER — LACTATED RINGERS IV SOLN
INTRAVENOUS | Status: DC
  Administered 2018-10-02 – 2018-10-03 (×3): via INTRAVENOUS

## 2018-10-02 MED ORDER — PHENYLEPHRINE 40 MCG/ML (10ML) SYRINGE FOR IV PUSH (FOR BLOOD PRESSURE SUPPORT)
80.0000 ug | PREFILLED_SYRINGE | INTRAVENOUS | Status: DC | PRN
  Filled 2018-10-02: qty 10

## 2018-10-02 MED ORDER — LACTATED RINGERS IV SOLN: 500.0000 mL | INTRAVENOUS | Status: DC | PRN

## 2018-10-02 MED ORDER — OXYTOCIN BOLUS FROM INFUSION: 500.0000 mL | Freq: Once | INTRAVENOUS | Status: DC

## 2018-10-02 MED ORDER — SOD CITRATE-CITRIC ACID 500-334 MG/5ML PO SOLN
30.0000 mL | ORAL | Status: DC | PRN
  Administered 2018-10-03: 08:00:00 30 mL via ORAL
  Filled 2018-10-02: qty 30

## 2018-10-02 NOTE — Plan of Care (Signed)
Pt resting in bed; pain medication offered but pt content with pain level right now.  Cat 1 strip, will leave pitocin at 14 per CNM request until FB comes out.  FB still firmly in place. All questions answered.  Will continue to monitor and communicate with team as needed.   Problem: Education: Goal: Knowledge of General Education information will improve Description Including pain rating scale, medication(s)/side effects and non-pharmacologic comfort measures Outcome: Progressing   Problem: Health Behavior/Discharge Planning: Goal: Ability to manage health-related needs will improve Outcome: Progressing   Problem: Clinical Measurements: Goal: Ability to maintain clinical measurements within normal limits will improve Outcome: Progressing Goal: Will remain free from infection Outcome: Progressing Goal: Diagnostic test results will improve Outcome: Progressing Goal: Respiratory complications will improve Outcome: Progressing Goal: Cardiovascular complication will be avoided Outcome: Progressing   Problem: Activity: Goal: Risk for activity intolerance will decrease Outcome: Progressing   Problem: Nutrition: Goal: Adequate nutrition will be maintained Outcome: Progressing   Problem: Coping: Goal: Level of anxiety will decrease Outcome: Progressing   Problem: Elimination: Goal: Will not experience complications related to bowel motility Outcome: Progressing Goal: Will not experience complications related to urinary retention Outcome: Progressing   Problem: Pain Managment: Goal: General experience of comfort will improve Outcome: Progressing   Problem: Safety: Goal: Ability to remain free from injury will improve Outcome: Progressing   Problem: Skin Integrity: Goal: Risk for impaired skin integrity will decrease Outcome: Progressing   Problem: Education: Goal: Knowledge of Childbirth will improve Outcome: Progressing Goal: Ability to make informed decisions  regarding treatment and plan of care will improve Outcome: Progressing Goal: Ability to state and carry out methods to decrease the pain will improve Outcome: Progressing Goal: Individualized Educational Video(s) Outcome: Progressing   Problem: Coping: Goal: Ability to verbalize concerns and feelings about labor and delivery will improve Outcome: Progressing   Problem: Life Cycle: Goal: Ability to make normal progression through stages of labor will improve Outcome: Progressing Goal: Ability to effectively push during vaginal delivery will improve Outcome: Progressing   Problem: Role Relationship: Goal: Will demonstrate positive interactions with the child Outcome: Progressing   Problem: Safety: Goal: Risk of complications during labor and delivery will decrease Outcome: Progressing   Problem: Pain Management: Goal: Relief or control of pain from uterine contractions will improve Outcome: Progressing

## 2018-10-02 NOTE — Anesthesia Preprocedure Evaluation (Addendum)
Anesthesia Evaluation  Patient identified by MRN, date of birth, ID band Patient awake    Reviewed: Allergy & Precautions, NPO status , Patient's Chart, lab work & pertinent test results  Airway Mallampati: II  TM Distance: >3 FB Neck ROM: Full    Dental no notable dental hx.    Pulmonary neg pulmonary ROS,    Pulmonary exam normal breath sounds clear to auscultation       Cardiovascular Exercise Tolerance: Good negative cardio ROS Normal cardiovascular exam Rhythm:Regular Rate:Normal     Neuro/Psych  Headaches, negative psych ROS   GI/Hepatic   Endo/Other    Renal/GU      Musculoskeletal   Abdominal   Peds  Hematology Hgb 11.3 Plt 227   Anesthesia Other Findings   Reproductive/Obstetrics (+) Pregnancy                            Anesthesia Physical Anesthesia Plan  ASA: II  Anesthesia Plan: Epidural   Post-op Pain Management:    Induction:   PONV Risk Score and Plan:   Airway Management Planned: Simple Face Mask and Natural Airway  Additional Equipment:   Intra-op Plan:   Post-operative Plan:   Informed Consent: I have reviewed the patients History and Physical, chart, labs and discussed the procedure including the risks, benefits and alternatives for the proposed anesthesia with the patient or authorized representative who has indicated his/her understanding and acceptance.       Plan Discussed with:   Anesthesia Plan Comments: (G3P1 for LEA and ina a trial of labor after ceasarian)        Anesthesia Quick Evaluation

## 2018-10-02 NOTE — Anesthesia Procedure Notes (Addendum)
Epidural Patient location during procedure: OB Start time: 10/02/2018 11:15 PM End time: 10/03/2018 11:27 PM  Staffing Anesthesiologist: Trevor Iha, MD Performed: anesthesiologist   Preanesthetic Checklist Completed: patient identified, site marked, surgical consent, pre-op evaluation, timeout performed, IV checked, risks and benefits discussed and monitors and equipment checked  Epidural Patient position: sitting Prep: site prepped and draped and DuraPrep Patient monitoring: continuous pulse ox and blood pressure Approach: midline Location: L3-L4 Injection technique: LOR air  Needle:  Needle type: Tuohy  Needle gauge: 17 G Needle length: 9 cm and 9 Needle insertion depth: 5 cm cm Catheter type: closed end flexible Catheter size: 19 Gauge Catheter at skin depth: 10 cm Test dose: negative  Assessment Events: blood not aspirated, injection not painful, no injection resistance, negative IV test and no paresthesia  Additional Notes Patient identified. Risks/Benefits/Options discussed with patient including but not limited to bleeding, infection, nerve damage, paralysis, failed block, incomplete pain control, headache, blood pressure changes, nausea, vomiting, reactions to medication both or allergic, itching and postpartum back pain. Confirmed with bedside nurse the patient's most recent platelet count. Confirmed with patient that they are not currently taking any anticoagulation, have any bleeding history or any family history of bleeding disorders. Patient expressed understanding and wished to proceed. All questions were answered. Sterile technique was used throughout the entire procedure. Please see nursing notes for vital signs. Test dose was given through epidural needle and negative prior to continuing to dose epidural or start infusion. Warning signs of high block given to the patient including shortness of breath, tingling/numbness in hands, complete motor block, or any  concerning symptoms with instructions to call for help. Patient was given instructions on fall risk and not to get out of bed. All questions and concerns addressed with instructions to call with any issues. 1  Attempt (S) . Patient tolerated procedure well.

## 2018-10-02 NOTE — H&P (Signed)
Megan Camacho is a 33 y.o. female  G3 P1101 at 40 wks and 6 days admitted due to post dates with Abnormal Fetal Testing (BPP in the office today 4 out 8).  Pregnancy complicated by h/o  cervical insufficiency  And h/o cesarean section due to fetal distress . Pt requst TOLAC. She had a mcdonald cerclage placed 04/10/2018 it was removed at [redacted] wks EGA. Prenatal care provided by Dr. Gerald Leitzara Thiago Ragsdale with [facility].  OB History    Gravida  3   Para  2   Term  1   Preterm  1   AB      Living  1     SAB      TAB      Ectopic      Multiple  0   Live Births  1          Past Medical History:  Diagnosis Date  . Spinal headache    Past Surgical History:  Procedure Laterality Date  . CERVICAL CERCLAGE    . CERVICAL CERCLAGE N/A 04/10/2018   Procedure: CERCLAGE CERVICAL;  Surgeon: Gerald Leitzole, Jorge Amparo, MD;  [facility];  Service: Gynecology;  Laterality: N/A;  . CESAREAN SECTION N/A 06/29/2014   Procedure: CESAREAN SECTION;  Surgeon: Robley FriesVaishali R Mody, MD;  Location: [facility];  Service: Obstetrics;  Laterality: N/A;  . WISDOM TOOTH EXTRACTION     Family History: family history includes Diabetes in her maternal grandmother. Social History:  reports that she has never smoked. She has never used smokeless tobacco. She reports that she does not drink alcohol or use drugs. Medications: PNV Allergies: NKDA    Maternal Diabetes: No Genetic Screening: Declined Maternal Ultrasounds/Referrals: Normal Fetal Ultrasounds or other Referrals:  None Maternal Substance Abuse:  No Significant Maternal Medications:  None Significant Maternal Lab Results:  Lab values include: Group B Strep negative Other Comments:  None  Review of Systems  Constitutional: Negative.   HENT: Negative.   Eyes: Negative.   Respiratory: Negative.   Cardiovascular: Negative.   Gastrointestinal: Negative.   Genitourinary: Negative.   Musculoskeletal: Negative.   Skin: Negative.   Neurological: Negative.    Endo/Heme/Allergies: Negative.   Psychiatric/Behavioral: Negative.    History Dilation: 2 Effacement (%): 70 Station: -2 Exam by:: Dr. Richardson Doppole Blood pressure 108/70, pulse 76, temperature 98.3 F (36.8 C), temperature source Oral, resp. rate 20, height 5' (1.524 m), weight 65.8 kg, last menstrual period 01/02/2018, currently breastfeeding. Maternal Exam:  Uterine Assessment: Contraction strength is moderate.  Contraction frequency is regular.   Abdomen: Patient reports no abdominal tenderness. Estimated fetal weight is 7 lbs 8 oz .   Fetal presentation: vertex  Introitus: Normal vulva. Normal vagina.  Pelvis: adequate for delivery.      Fetal Exam Fetal Monitor Review: Baseline rate: 130.  Variability: moderate (6-25 bpm).   Pattern: accelerations present and no decelerations.    Fetal State Assessment: Category I - tracings are normal.     Physical Exam  Constitutional: She is oriented to person, place, and time. She appears well-developed and well-nourished.  HENT:  Head: Normocephalic and atraumatic.  Eyes: Pupils are equal, round, and reactive to light. Conjunctivae are normal.  Neck: Normal range of motion. Neck supple.  Cardiovascular: Normal rate and regular rhythm.  Respiratory: Effort normal and breath sounds normal.  GI: There is no abdominal tenderness.  Genitourinary:    Vulva and vagina normal.   Musculoskeletal: Normal range of motion.  General: No edema.  Neurological: She is alert and oriented to person, place, and time.  Skin: Skin is warm and dry.  Psychiatric: She has a normal mood and affect.    Cook Balloon placed and inflated with 60 cc of Water  Prenatal labs: ABO, Rh: --/--/O POS, O POS Performed at [facility], [address], [place], [place] [address]  [phone]/27 1351) Antibody: NEG (05/27 1351) Rubella: Immune (10/18 0000) RPR: Nonreactive (10/18 0000)  HBsAg: Negative (10/18 0000)  HIV: Non-reactive (10/18 0000)  GBS:  Negative (05/07 0000)   Assessment/Plan: 40 wks and 6 days admitted for induction due to abnormal BPP in the office today.  -H/O cesarean section - pt request TOLAC. D/w pt r/b/a .Marland Kitchen VBAC consent form signed.  Pitocin for induction.  Foley bulb in place.  AROM with fetal decent .  -Fetal Well Being - Category 1 tracing.  Anticipate SVD [facility] Midwife and Dr. Sallye Ober covering after 7 pm. I will assume care at 7 am 10/03/2018   Gerald Leitz 10/02/2018, 6:06 PM

## 2018-10-02 NOTE — Progress Notes (Addendum)
Labor Progress Note  Megan Camacho is a 33 y.o. female  G3 P1101 at 40 wks and 6 days admitted due to post dates with Abnormal Fetal Testing (BPP in the office today 4 out 8). Pregnancy complicated by h/o  cervical insufficiency  And h/o cesarean section due to fetal distress . Pt requst TOLAC. She had a mcdonald cerclage placed 04/10/2018 it was removed at [redacted] wks EGA. Prenatal care provided by Dr. Gerald Leitz with [facility].  Subjective: I assumed care for pt in NAD, pt stable and resting in bed, endorses feeling cxt, but able to cope, pt plans on epidural once foley bulbs falls out, birth plan at bedside, I reviewed risk and benefit about birthing plan, AROM, pitocin, epidural, answered question, pt verbalized understanding.  Patient Active Problem List   Diagnosis Date Noted  . Post-dates pregnancy 10/02/2018  . Cervical incompetence affecting management of mother, antepartum 04/10/2018  . Placenta abruption, delivered, current hospitalization 06/29/2014  . Postpartum care following cesarean delivery (2/22) 06/29/2014  . Indication for care in labor or delivery 06/28/2014   Objective: BP 99/74   Pulse 88   Temp 97.9 F (36.6 C) (Oral)   Resp 16   Ht 5' (1.524 m)   Wt 65.8 kg   LMP 01/02/2018   BMI 28.32 kg/m  No intake/output data recorded. No intake/output data recorded. NST: FHR baseline 125 bpm, Variability: moderate, Accelerations:present, Decelerations:  Absent= Cat 1/Reactive CTX:  irregular, every 2-3 minutes, lasting 50-70 seconds Uterus gravid, soft no-n tender, mild to palpate with contractions. Maternal perception pain 6/10   Deferred vaginal exam, last exam per Dr Richardson Dopp @ 1800 on 5/27 SVE:  Dilation: 2 Effacement (%): 70 Station: -2 Exam by:: Dr. Richardson Dopp Pitocin at 14 mUn/min  Assessment:  Megan Camacho is a 33 y.o. female  G3 P1101 at 40 wks and 6 days admitted due to post dates with Abnormal Fetal Testing (BPP in the office today 4 out 8). Pregnancy complicated by  h/o  cervical insufficiency  And h/o cesarean section due to fetal distress . Pt requst TOLAC. She had a mcdonald cerclage placed 04/10/2018 it was removed at [redacted] wks EGA. Prenatal care provided by Dr. Gerald Leitz with [facility]. Pt stable in bed feeling cxt, with foley bulb in, tolerating well with heating pad, vaginal exam deferred, birth plan at bedside. R/B/A reviewed about pitocin, AROM, epidural.  Patient Active Problem List   Diagnosis Date Noted  . Post-dates pregnancy 10/02/2018  . Cervical incompetence affecting management of mother, antepartum 04/10/2018  . Placenta abruption, delivered, current hospitalization 06/29/2014  . Postpartum care following cesarean delivery (2/22) 06/29/2014  . Indication for care in labor or delivery 06/28/2014   NICHD: Category 1  Membranes:  Intact, no s/s of infection  Induction:    Foley Bulb: inserted  @1800  on 5/27  Pitocin - 14  Pain management:               IV pain management: x 1dose of 50 mcg @ 1927 on 5/27             Epidural placement:  PRN  GBS Negative  Plan: Continue labor plan Continuous/intermittent monitoring Rest/Ambulate/Frequent position changes to facilitate fetal rotation and descent. Will reassess with cervical exam at 0200 or earlier if necessary Continue pitocin per protocol, but hold @ 14 units until foley bulb out, then may continue to titrate per fetal tolerance/cxt Plans to get epidural once foley bulb falls out.  Anticipate labor progression  and vaginal delivery.   Md Dillard to be updated PRN.    Dale DurhamJade Kaycee Mcgaugh, NP-C, CNM, MSN 10/02/2018. 8:48 PM

## 2018-10-03 ENCOUNTER — Encounter (HOSPITAL_COMMUNITY): Payer: Self-pay

## 2018-10-03 ENCOUNTER — Inpatient Hospital Stay (HOSPITAL_COMMUNITY): Payer: PPO

## 2018-10-03 ENCOUNTER — Encounter (HOSPITAL_COMMUNITY)
Admission: AD | Disposition: A | Discharge status: Home / Self Care | Payer: Self-pay | Source: Home / Self Care | Attending: Obstetrics and Gynecology

## 2018-10-03 LAB — ABO/RH: ABO/RH(D): O POS

## 2018-10-03 LAB — RPR: RPR Ser Ql: NONREACTIVE

## 2018-10-03 SURGERY — Surgical Case
Anesthesia: Epidural

## 2018-10-03 MED ORDER — SIMETHICONE 80 MG PO CHEW
80.0000 mg | CHEWABLE_TABLET | Freq: Three times a day (TID) | ORAL | Status: DC
  Administered 2018-10-03 – 2018-10-05 (×4): 80 mg via ORAL
  Filled 2018-10-03 (×4): qty 1

## 2018-10-03 MED ORDER — LIDOCAINE-EPINEPHRINE (PF) 2 %-1:200000 IJ SOLN
INTRAMUSCULAR | Status: DC | PRN
  Administered 2018-10-03: 5 mL via EPIDURAL
  Administered 2018-10-03: 10 mL via EPIDURAL

## 2018-10-03 MED ORDER — WITCH HAZEL-GLYCERIN EX PADS: 1.0000 "application " | MEDICATED_PAD | CUTANEOUS | Status: DC | PRN

## 2018-10-03 MED ORDER — LACTATED RINGERS IV SOLN
INTRAVENOUS | Status: DC
  Administered 2018-10-03 (×2): via INTRAVENOUS

## 2018-10-03 MED ORDER — HYDROMORPHONE HCL 1 MG/ML IJ SOLN: 0.2000 mg | INTRAMUSCULAR | Status: DC | PRN

## 2018-10-03 MED ORDER — PRENATAL MULTIVITAMIN CH
1.0000 | ORAL_TABLET | Freq: Every day | ORAL | Status: DC
  Administered 2018-10-04 – 2018-10-05 (×2): 1 via ORAL
  Filled 2018-10-03 (×2): qty 1

## 2018-10-03 MED ORDER — DIPHENHYDRAMINE HCL 25 MG PO CAPS: 25.0000 mg | ORAL_CAPSULE | ORAL | Status: DC | PRN

## 2018-10-03 MED ORDER — KETOROLAC TROMETHAMINE 30 MG/ML IJ SOLN
30.0000 mg | Freq: Four times a day (QID) | INTRAMUSCULAR | Status: AC
  Filled 2018-10-03 (×2): qty 1

## 2018-10-03 MED ORDER — FENTANYL CITRATE (PF) 100 MCG/2ML IJ SOLN
INTRAMUSCULAR | Status: DC | PRN
  Administered 2018-10-03: 100 ug via EPIDURAL
  Administered 2018-10-03 (×2): 50 ug via INTRAVENOUS

## 2018-10-03 MED ORDER — METHYLERGONOVINE MALEATE 0.2 MG/ML IJ SOLN: 0.2000 mg | INTRAMUSCULAR | Status: DC | PRN

## 2018-10-03 MED ORDER — SOD CITRATE-CITRIC ACID 500-334 MG/5ML PO SOLN: 30.0000 mL | ORAL | Status: DC

## 2018-10-03 MED ORDER — NALBUPHINE HCL 10 MG/ML IJ SOLN: 5.0000 mg | Freq: Once | INTRAMUSCULAR | Status: DC | PRN

## 2018-10-03 MED ORDER — LIDOCAINE HCL (PF) 1 % IJ SOLN
INTRAMUSCULAR | Status: DC | PRN
  Administered 2018-10-02: 5 mL via EPIDURAL

## 2018-10-03 MED ORDER — CEFAZOLIN SODIUM-DEXTROSE 2-4 GM/100ML-% IV SOLN
2.0000 g | INTRAVENOUS | Status: AC
  Administered 2018-10-03: 2 g via INTRAVENOUS

## 2018-10-03 MED ORDER — COCONUT OIL OIL
1.0000 "application " | TOPICAL_OIL | Status: DC | PRN
  Administered 2018-10-04: 1 via TOPICAL

## 2018-10-03 MED ORDER — ONDANSETRON HCL 4 MG/2ML IJ SOLN
INTRAMUSCULAR | Status: DC | PRN
  Administered 2018-10-03: 4 mg via INTRAVENOUS

## 2018-10-03 MED ORDER — OXYTOCIN 40 UNITS IN NORMAL SALINE INFUSION - SIMPLE MED: 2.5000 [IU]/h | INTRAVENOUS | Status: AC

## 2018-10-03 MED ORDER — IBUPROFEN 800 MG PO TABS
800.0000 mg | ORAL_TABLET | Freq: Four times a day (QID) | ORAL | Status: DC
  Administered 2018-10-04 – 2018-10-05 (×5): 800 mg via ORAL
  Filled 2018-10-03 (×5): qty 1

## 2018-10-03 MED ORDER — SIMETHICONE 80 MG PO CHEW: 80.0000 mg | CHEWABLE_TABLET | ORAL | Status: DC | PRN

## 2018-10-03 MED ORDER — LIDOCAINE-EPINEPHRINE (PF) 2 %-1:200000 IJ SOLN
INTRAMUSCULAR | Status: AC
  Filled 2018-10-03: qty 20

## 2018-10-03 MED ORDER — NALBUPHINE HCL 10 MG/ML IJ SOLN: 5.0000 mg | INTRAMUSCULAR | Status: DC | PRN

## 2018-10-03 MED ORDER — SODIUM CHLORIDE (PF) 0.9 % IJ SOLN
INTRAMUSCULAR | Status: DC | PRN
  Administered 2018-10-02: 12 mL/h via EPIDURAL

## 2018-10-03 MED ORDER — PHENYLEPHRINE 40 MCG/ML (10ML) SYRINGE FOR IV PUSH (FOR BLOOD PRESSURE SUPPORT)
PREFILLED_SYRINGE | INTRAVENOUS | Status: AC
  Filled 2018-10-03: qty 10

## 2018-10-03 MED ORDER — BUPIVACAINE HCL (PF) 0.25 % IJ SOLN
INTRAMUSCULAR | Status: DC | PRN
  Administered 2018-10-03: 8 mL/h via EPIDURAL

## 2018-10-03 MED ORDER — SODIUM CHLORIDE 0.9 % IV SOLN
INTRAVENOUS | Status: DC | PRN
  Administered 2018-10-03: 40 [IU] via INTRAVENOUS

## 2018-10-03 MED ORDER — OXYTOCIN 40 UNITS IN NORMAL SALINE INFUSION - SIMPLE MED
1.0000 m[IU]/min | INTRAVENOUS | Status: DC
  Administered 2018-10-03: 04:00:00 1 m[IU]/min via INTRAVENOUS

## 2018-10-03 MED ORDER — MORPHINE SULFATE (PF) 0.5 MG/ML IJ SOLN
INTRAMUSCULAR | Status: AC
  Filled 2018-10-03: qty 10

## 2018-10-03 MED ORDER — ZOLPIDEM TARTRATE 5 MG PO TABS: 5.0000 mg | ORAL_TABLET | Freq: Every evening | ORAL | Status: DC | PRN

## 2018-10-03 MED ORDER — DIPHENHYDRAMINE HCL 50 MG/ML IJ SOLN: 12.5000 mg | INTRAMUSCULAR | Status: DC | PRN

## 2018-10-03 MED ORDER — SODIUM CHLORIDE 0.9 % IV SOLN
INTRAVENOUS | Status: DC | PRN
  Administered 2018-10-03: 09:00:00 via INTRAVENOUS

## 2018-10-03 MED ORDER — DIPHENHYDRAMINE HCL 25 MG PO CAPS: 25.0000 mg | ORAL_CAPSULE | Freq: Four times a day (QID) | ORAL | Status: DC | PRN

## 2018-10-03 MED ORDER — SODIUM BICARBONATE 8.4 % IV SOLN
INTRAVENOUS | Status: AC
  Filled 2018-10-03: qty 50

## 2018-10-03 MED ORDER — SENNOSIDES-DOCUSATE SODIUM 8.6-50 MG PO TABS
2.0000 | ORAL_TABLET | ORAL | Status: DC
  Administered 2018-10-04 – 2018-10-05 (×2): 2 via ORAL
  Filled 2018-10-03 (×2): qty 2

## 2018-10-03 MED ORDER — MENTHOL 3 MG MT LOZG: 1.0000 | LOZENGE | OROMUCOSAL | Status: DC | PRN

## 2018-10-03 MED ORDER — METHYLERGONOVINE MALEATE 0.2 MG PO TABS: 0.2000 mg | ORAL_TABLET | ORAL | Status: DC | PRN

## 2018-10-03 MED ORDER — OXYCODONE HCL 5 MG PO TABS: 5.0000 mg | ORAL_TABLET | ORAL | Status: DC | PRN

## 2018-10-03 MED ORDER — FERROUS SULFATE 325 (65 FE) MG PO TABS
325.0000 mg | ORAL_TABLET | Freq: Two times a day (BID) | ORAL | Status: DC
  Administered 2018-10-03 – 2018-10-05 (×4): 325 mg via ORAL
  Filled 2018-10-03 (×4): qty 1

## 2018-10-03 MED ORDER — SODIUM BICARBONATE 8.4 % IV SOLN
INTRAVENOUS | Status: DC | PRN
  Administered 2018-10-03 (×2): 5 mL via EPIDURAL

## 2018-10-03 MED ORDER — LACTATED RINGERS AMNIOINFUSION
INTRAVENOUS | Status: DC
  Administered 2018-10-03 (×2): via INTRAUTERINE

## 2018-10-03 MED ORDER — SCOPOLAMINE 1 MG/3DAYS TD PT72
1.0000 | MEDICATED_PATCH | Freq: Once | TRANSDERMAL | Status: DC
  Filled 2018-10-03: qty 1

## 2018-10-03 MED ORDER — ONDANSETRON HCL 4 MG/2ML IJ SOLN
INTRAMUSCULAR | Status: AC
  Filled 2018-10-03: qty 2

## 2018-10-03 MED ORDER — OXYTOCIN 40 UNITS IN NORMAL SALINE INFUSION - SIMPLE MED
INTRAVENOUS | Status: AC
  Filled 2018-10-03: qty 1000

## 2018-10-03 MED ORDER — PROPOFOL 10 MG/ML IV BOLUS
INTRAVENOUS | Status: AC
  Filled 2018-10-03: qty 20

## 2018-10-03 MED ORDER — ACETAMINOPHEN 325 MG PO TABS
650.0000 mg | ORAL_TABLET | ORAL | Status: DC | PRN
  Filled 2018-10-03: qty 2

## 2018-10-03 MED ORDER — MORPHINE SULFATE (PF) 0.5 MG/ML IJ SOLN
INTRAMUSCULAR | Status: DC | PRN
  Administered 2018-10-03: 3 mg via EPIDURAL

## 2018-10-03 MED ORDER — NALOXONE HCL 4 MG/10ML IJ SOLN
1.0000 ug/kg/h | INTRAVENOUS | Status: DC | PRN
  Filled 2018-10-03: qty 5

## 2018-10-03 MED ORDER — SODIUM CHLORIDE 0.9 % IR SOLN
Status: DC | PRN
  Administered 2018-10-03: 1000 mL

## 2018-10-03 MED ORDER — DIBUCAINE (PERIANAL) 1 % EX OINT: 1.0000 "application " | TOPICAL_OINTMENT | CUTANEOUS | Status: DC | PRN

## 2018-10-03 MED ORDER — FENTANYL CITRATE (PF) 100 MCG/2ML IJ SOLN
INTRAMUSCULAR | Status: AC
  Filled 2018-10-03: qty 2

## 2018-10-03 MED ORDER — SIMETHICONE 80 MG PO CHEW
80.0000 mg | CHEWABLE_TABLET | ORAL | Status: DC
  Administered 2018-10-04 – 2018-10-05 (×2): 80 mg via ORAL
  Filled 2018-10-03 (×2): qty 1

## 2018-10-03 SURGICAL SUPPLY — 34 items
BARRIER ADHS 3X4 INTERCEED (GAUZE/BANDAGES/DRESSINGS) ×3 IMPLANT
BENZOIN TINCTURE PRP APPL 2/3 (GAUZE/BANDAGES/DRESSINGS) ×3 IMPLANT
CHLORAPREP W/TINT 26ML (MISCELLANEOUS) ×3 IMPLANT
CLAMP CORD UMBIL (MISCELLANEOUS) IMPLANT
CLOSURE WOUND 1/2 X4 (GAUZE/BANDAGES/DRESSINGS) ×1
CLOTH BEACON ORANGE TIMEOUT ST (SAFETY) ×3 IMPLANT
DRSG OPSITE POSTOP 4X10 (GAUZE/BANDAGES/DRESSINGS) ×3 IMPLANT
ELECT REM PT RETURN 9FT ADLT (ELECTROSURGICAL) ×3
ELECTRODE REM PT RTRN 9FT ADLT (ELECTROSURGICAL) ×1 IMPLANT
EXTRACTOR VACUUM KIWI (MISCELLANEOUS) IMPLANT
GLOVE BIOGEL M 7.0 STRL (GLOVE) ×6 IMPLANT
GLOVE BIOGEL PI IND STRL 7.0 (GLOVE) ×3 IMPLANT
GLOVE BIOGEL PI INDICATOR 7.0 (GLOVE) ×6
GOWN STRL REUS W/TWL LRG LVL3 (GOWN DISPOSABLE) ×9 IMPLANT
KIT ABG SYR 3ML LUER SLIP (SYRINGE) IMPLANT
NEEDLE HYPO 25X5/8 SAFETYGLIDE (NEEDLE) IMPLANT
NS IRRIG 1000ML POUR BTL (IV SOLUTION) ×3 IMPLANT
PACK C SECTION WH (CUSTOM PROCEDURE TRAY) ×3 IMPLANT
PAD OB MATERNITY 4.3X12.25 (PERSONAL CARE ITEMS) ×3 IMPLANT
PENCIL SMOKE EVAC W/HOLSTER (ELECTROSURGICAL) ×3 IMPLANT
RTRCTR C-SECT PINK 25CM LRG (MISCELLANEOUS) IMPLANT
STRIP CLOSURE SKIN 1/2X4 (GAUZE/BANDAGES/DRESSINGS) ×2 IMPLANT
SUT PDS AB 0 CT1 27 (SUTURE) ×6 IMPLANT
SUT PLAIN 0 NONE (SUTURE) IMPLANT
SUT VIC AB 0 CTX 36 (SUTURE) ×6
SUT VIC AB 0 CTX36XBRD ANBCTRL (SUTURE) ×3 IMPLANT
SUT VIC AB 2-0 CT1 27 (SUTURE) ×2
SUT VIC AB 2-0 CT1 TAPERPNT 27 (SUTURE) ×1 IMPLANT
SUT VIC AB 3-0 SH 27 (SUTURE)
SUT VIC AB 3-0 SH 27X BRD (SUTURE) IMPLANT
SUT VIC AB 4-0 KS 27 (SUTURE) ×3 IMPLANT
TOWEL OR 17X24 6PK STRL BLUE (TOWEL DISPOSABLE) ×3 IMPLANT
TRAY FOLEY W/BAG SLVR 14FR LF (SET/KITS/TRAYS/PACK) ×3 IMPLANT
WATER STERILE IRR 1000ML POUR (IV SOLUTION) ×3 IMPLANT

## 2018-10-03 NOTE — Progress Notes (Signed)
  Subjective: In to see patient due to report of repetitive late decelerations despite cessation of pitocin.   Objective: BP 113/76   Pulse (!) 111   Temp 98.5 F (36.9 C) (Oral)   Resp 16   Ht 5' (1.524 m)   Wt 65.8 kg   LMP 01/02/2018   SpO2 100%   BMI 28.32 kg/m  I/O last 3 completed shifts: In: 1490.1 [I.V.:1490.1] Out: 695 [Urine:695] No intake/output data recorded.  FHT:  FHR: 130 bpm, variability: moderate,  accelerations:  Abscent,  decelerations:  Present late UC:   regular, every 2 minutes SVE:   Dilation: 7.5 Effacement (%): 90 Station: 0 Exam by:: Dr. Richardson Dopp  Labs: Lab Results  Component Value Date   WBC 6.3 10/02/2018   HGB 11.3 (L) 10/02/2018   HCT 34.8 (L) 10/02/2018   MCV 84.7 10/02/2018   PLT 227 10/02/2018    Assessment / Plan: Fetal intolerance of labor. Recommend cesarean section. R/B/A of cesarean section discussed with the patient including but not limited to infection, bleeding damage to bowel bladder and baby with the need for further surgery. Pt voiced understanding and desires to proceed.   Gerald Leitz 10/03/2018, 8:16 AM

## 2018-10-03 NOTE — Addendum Note (Signed)
Addendum  created 10/03/18 1516 by Orlie Pollen, CRNA   Clinical Note Signed

## 2018-10-03 NOTE — Progress Notes (Signed)
   Subjective: Pt comfortable with epidural   Objective: BP 110/73   Pulse 83   Temp 99 F (37.2 C) (Oral)   Resp 18   Ht 5' (1.524 m)   Wt 65.8 kg   LMP 01/02/2018   SpO2 100%   BMI 28.32 kg/m  I/O last 3 completed shifts: In: 1490.1 [I.V.:1490.1] Out: 695 [Urine:695] No intake/output data recorded.  FHT:  FHR: 130 bpm, variability: moderate,  accelerations:  Present,  decelerations:  Present occasional variable and late.Marland Kitchen  UC:   regular, every 2 minutes SVE:  7.5 / 90/0 FSE placed. Thick meconium noted. Positive fetal scalp stimulation  Labs: Lab Results  Component Value Date   WBC 6.3 10/02/2018   HGB 11.3 (L) 10/02/2018   HCT 34.8 (L) 10/02/2018   MCV 84.7 10/02/2018   PLT 227 10/02/2018    Assessment / Plan: Induction of labor due to non-reassuring fetal testing and postterm,  progressing well on pitocin  Labor: progressing on pitocin  Preeclampsia:  NA Fetal Wellbeing:  Category I and Category II will monitor closely if pt has recurrent late decelerations discontinue pitocin Pain Control:  Epidural I/D:  n/a Anticipated MOD:  NSVD  Gerald Leitz 10/03/2018, 7:46 AM

## 2018-10-03 NOTE — Progress Notes (Signed)
Unable to accurately assess patient's blood pressure due to maternal shaking. Nurse at bedside; BP cuff will be moved to right leg.

## 2018-10-03 NOTE — Anesthesia Postprocedure Evaluation (Signed)
Anesthesia Post Note  Patient: Megan Camacho  Procedure(s) Performed: CESAREAN SECTION (N/A )     Patient location during evaluation: Mother Baby Anesthesia Type: Epidural Level of consciousness: awake and alert Pain management: pain level controlled Vital Signs Assessment: post-procedure vital signs reviewed and stable Respiratory status: spontaneous breathing, nonlabored ventilation and respiratory function stable Cardiovascular status: stable Postop Assessment: no headache, no backache and epidural receding Anesthetic complications: no    Last Vitals:  Vitals:   10/03/18 1215 10/03/18 1330  BP: 116/66 122/75  Pulse: 90 (!) 111  Resp: 20 20  Temp: 37.2 C 37.4 C  SpO2: 97% 97%    Last Pain:  Vitals:   10/03/18 1330  TempSrc: Oral  PainSc: 0-No pain   Pain Goal:                   [employer]

## 2018-10-03 NOTE — Transfer of Care (Signed)
Immediate Anesthesia Transfer of Care Note  Patient: Megan Camacho  Procedure(s) Performed: CESAREAN SECTION (N/A )  Patient Location: PACU  Anesthesia Type:Epidural  Level of Consciousness: awake, alert  and oriented  Airway & Oxygen Therapy: Patient Spontanous Breathing  Post-op Assessment: Report given to RN and Post -op Vital signs reviewed and stable  Post vital signs: Reviewed and stable  Last Vitals:  Vitals Value Taken Time  BP 186/144 10/03/2018  9:58 AM  Temp    Pulse 100 10/03/2018  9:59 AM  Resp 28 10/03/2018  9:59 AM  SpO2 96 % 10/03/2018  9:59 AM  Vitals shown include unvalidated device data.  Last Pain:  Vitals:   10/03/18 0803  TempSrc:   PainSc: 5          Complications: No apparent anesthesia complications

## 2018-10-03 NOTE — Anesthesia Postprocedure Evaluation (Signed)
Anesthesia Post Note  Patient: Shamarie Colbath  Procedure(s) Performed: CESAREAN SECTION (N/A )     Patient location during evaluation: PACU Anesthesia Type: Epidural Level of consciousness: awake and alert Pain management: pain level controlled Vital Signs Assessment: post-procedure vital signs reviewed and stable Respiratory status: spontaneous breathing and respiratory function stable Cardiovascular status: blood pressure returned to baseline and stable Postop Assessment: spinal receding Anesthetic complications: no    Last Vitals:  Vitals:   10/03/18 1045 10/03/18 1100  BP: 95/75 102/63  Pulse:  84  Resp: (!) 26 15  Temp: 36.9 C   SpO2: 98% 96%    Last Pain:  Vitals:   10/03/18 1045  TempSrc: Oral  PainSc: 0-No pain   Pain Goal:                Epidural/Spinal Function Cutaneous sensation: Tingles (10/03/18 1045), Patient able to flex knees: Yes (10/03/18 1045), Patient able to lift hips off bed: Yes (10/03/18 1045), Back pain beyond tenderness at insertion site: No (10/03/18 1045), Progressively worsening motor and/or sensory loss: No (10/03/18 1045), Bowel and/or bladder incontinence post epidural: No (10/03/18 1045)  Ellarose Brandi DANIEL

## 2018-10-03 NOTE — Progress Notes (Signed)
Labor Progress Note  Megan Camacho is a 33 y.o. female  G3 P1101 at 40 wks and 6 days admitted due to post dates with Abnormal Fetal Testing (BPP in the office today 4 out 8). Pregnancy complicated by h/o  cervical insufficiency  And h/o cesarean section due to fetal distress . Pt requst TOLAC. She had a mcdonald cerclage placed 04/10/2018 it was removed at [redacted] wks EGA. Prenatal care provided by Dr. Gerald Leitz with [facility].  Subjective: Pt comfortable post epidural placement in bed. Pt in NAD and ready for a nap. Pt declines wanting her water broke at this time. Foley bulb out.  Patient Active Problem List   Diagnosis Date Noted  . Post-dates pregnancy 10/02/2018  . Cervical incompetence affecting management of mother, antepartum 04/10/2018  . Placenta abruption, delivered, current hospitalization 06/29/2014  . Postpartum care following cesarean delivery (2/22) 06/29/2014  . Indication for care in labor or delivery 06/28/2014   Objective: BP 115/89   Pulse 88   Temp 98.1 F (36.7 C) (Oral)   Resp 20   Ht 5' (1.524 m)   Wt 65.8 kg   LMP 01/02/2018   SpO2 99%   BMI 28.32 kg/m  No intake/output data recorded. Total I/O In: 1079.2 [I.V.:1079.2] Out: -  NST: FHR baseline 130 bpm, Variability: moderate, Accelerations:present, Decelerations:  Absent= Cat 1/Reactive CTX:  irregular, every 2-3 minutes, lasting 60-80 seconds Uterus gravid, soft no-n tender, moderate to palpate  SVE:  Dilation: 5.5 Effacement (%): 70, 80 Station: -2 Exam by:: K Faucett RN Pitocin at 14 mUn/min  Assessment:  Megan Camacho is a 33 y.o. female  G3 P1101 at 40 wks and 6 days admitted due to post dates with Abnormal Fetal Testing (BPP in the office today 4 out 8). Pregnancy complicated by h/o  cervical insufficiency  And h/o cesarean section due to fetal distress . Pt requst TOLAC. She had a mcdonald cerclage placed 04/10/2018 it was removed at [redacted] wks EGA. Prenatal care provided by Dr. Gerald Leitz with [facility]. Pt stable in bed foley bulb out, pt post comfortable with epidural, declined AROM at this time.  Patient Active Problem List   Diagnosis Date Noted  . Post-dates pregnancy 10/02/2018  . Cervical incompetence affecting management of mother, antepartum 04/10/2018  . Placenta abruption, delivered, current hospitalization 06/29/2014  . Postpartum care following cesarean delivery (2/22) 06/29/2014  . Indication for care in labor or delivery 06/28/2014   NICHD: Category 1  Membranes:  Intact, no s/s of infection  Induction:    Foley Bulb: inserted  @1800  on 5/27, out @ 2300  Pitocin - 14  Pain management:               IV pain management: x 1dose of 50 mcg @ 1927 on 5/27             Epidural placement:  Placed @ 2315 on 5/27  GBS Negative  Plan: Continue labor plan Continuous monitoring Rest/Frequent position changes to facilitate fetal rotation and descent with peanut ball. Will reassess with cervical exam at 0600 or earlier if necessary Continue pitocin per protocol Plans to sleep Anticipate labor progression and vaginal delivery.   Md Dillard to be updated PRN.    Dale [place], NP-C, CNM, MSN 10/03/2018. 12:21 AM

## 2018-10-03 NOTE — Progress Notes (Signed)
Labor Progress Note  Megan Camacho is a 33 y.o. female  G3 P1101 at 40 wks and 6 days admitted due to post dates with Abnormal Fetal Testing (BPP in the office today 4 out 8). Pregnancy complicated by h/o  cervical insufficiency  And h/o cesarean section due to fetal distress . Pt requst TOLAC. She had a mcdonald cerclage placed 04/10/2018 it was removed at [redacted] wks EGA. Prenatal care provided by Dr. Gerald Leitz with [facility].  Subjective: Pt in NAD in bed sleeping. RN report pt requesting epidural re-dosing @ 0345, pt comfortable again after that.   Patient Active Problem List   Diagnosis Date Noted  . Post-dates pregnancy 10/02/2018  . Cervical incompetence affecting management of mother, antepartum 04/10/2018  . Placenta abruption, delivered, current hospitalization 06/29/2014  . Postpartum care following cesarean delivery (2/22) 06/29/2014  . Indication for care in labor or delivery 06/28/2014   Objective: BP 103/60   Pulse 88   Temp 99 F (37.2 C) (Oral)   Resp 16   Ht 5' (1.524 m)   Wt 65.8 kg   LMP 01/02/2018   SpO2 97%   BMI 28.32 kg/m  No intake/output data recorded. Total I/O In: 1490.1 [I.V.:1490.1] Out: 695 [Urine:695] NST: FHR baseline 125 bpm, Variability: moderate, Accelerations:present, Decelerations:  Absent= Cat 1/Non-Reactive CTX:  irregular, every 2-3 minutes, lasting 60-80 seconds Uterus gravid, soft non tender, moderate to palpate  SVE:  Dilation: 6 Effacement (%): 100 Station: -1, 0 Exam by:: K Faucett RN Pitocin at ( 1) mUn/min  IUPC placed tolerated well, MVU: 150  Assessment:  Megan Camacho is a 33 y.o. female  G3 P1101 at 40 wks and 6 days admitted due to post dates with Abnormal Fetal Testing (BPP in the office today 4 out 8). Pregnancy complicated by h/o  cervical insufficiency  And h/o cesarean section due to fetal distress . Pt requst TOLAC. She had a mcdonald cerclage placed 04/10/2018 it was removed at [redacted] wks EGA. Prenatal care provided by Dr.  Gerald Leitz with [facility]. Pt stable and sleeping, comfortable with epidural rebolus. Restarted pitocin.  Patient Active Problem List   Diagnosis Date Noted  . Post-dates pregnancy 10/02/2018  . Cervical incompetence affecting management of mother, antepartum 04/10/2018  . Placenta abruption, delivered, current hospitalization 06/29/2014  . Postpartum care following cesarean delivery (2/22) 06/29/2014  . Indication for care in labor or delivery 06/28/2014   NICHD: Category 1  Membranes:  AROM, thick mec @ 0200 on 5/28 no s/s of infection  Induction:    Foley Bulb: inserted  @1800  on 5/27, out @ 2300  Pitocin - Was at 14, turned to 7 @ 0028 for tachysytole, then d/c'ed @ 0217, restarted @ 1 @ 0545  IUPC: MVU 150  Pain management:               IV pain management: x 1dose of 50 mcg @ 1927 on 5/27             Epidural placement:  Placed @ 2315 on 5/27  GBS Negative  Plan: Continue labor plan Continuous monitoring Rest/Frequent position changes to facilitate fetal rotation and descent with peanut ball. Dr Richardson Dopp to reassess with cervical exam when if necessary Continue with pitocin.  Titrate pitocin to MVU of 200 when restarted Continue with Amnioinfusion of 14ml/hr for variables Plans to sleep Anticipate labor progression and vaginal delivery.   Md Richardson Dopp given report and is assume care of pt.     Lesly Rubenstein  [facility], NP-C, CNM, MSN 10/03/2018. 6:40 AM

## 2018-10-03 NOTE — Op Note (Signed)
Cesarean Section Procedure Note  Indications: non-reassuring fetal status  Pre-operative Diagnosis: 41 week 0 day pregnancy. Fetal intolerance of labor  Post-operative Diagnosis: same  Surgeon: Gerald Leitz   Assistants: Pearson Forster  RNFA Anesthesia: Epidural anesthesia  ASA Class: 2   Procedure Details   The patient was seen in the Holding Room. The risks, benefits, complications, treatment options, and expected outcomes were discussed with the patient.  The patient concurred with the proposed plan, giving informed consent.  The site of surgery properly noted/marked. The patient was taken to Operating Room # c, identified as Megan Camacho and the procedure verified as C-Section Delivery. A Time Out was held and the above information confirmed.  After induction of anesthesia, the patient was draped and prepped in the usual sterile manner. A Pfannenstiel incision was made and carried down through the subcutaneous tissue to the fascia. Fascial incision was made and extended transversely. The fascia was separated from the underlying rectus tissue superiorly and inferiorly. The peritoneum was identified and entered. Peritoneal incision was extended longitudinally. The utero-vesical peritoneal reflection was incised transversely and the bladder flap was bluntly freed from the lower uterine segment. A low transverse uterine incision was made. Delivered from cephalic presentation was a  Female with Apgar scores of 7 at one minute and 9 at five minutes. After the umbilical cord was clamped and cut cord blood was obtained for evaluation. The placenta was removed intact and appeared normal. The uterine outline, tubes and ovaries appeared normal. The uterine incision was closed with running locked sutures of o vicryl. A second layer of 0 vicryl was used to imbricate the incision. . Hemostasis was observed. Lavage was carried out until clear. Interseed was placed along the uterine incision. The fascia was then  reapproximated with running sutures of 0 pds. The skin was reapproximated with 4-0 vicryl.  Instrument, sponge, and needle counts were correct prior the abdominal closure and at the conclusion of the case.   Findings: Female infant in the cephalic presentation. Thick meconium. Normal fallopian tubes and ovaries   Estimated Blood Loss:  215 mL         Drains: None         Total IV Fluids:  Per anesthesia ml         Specimens: Placenta to labor and delivery           Implants: None         Complications:  None; patient tolerated the procedure well.         Disposition: PACU - hemodynamically stable.         Condition: stable  Attending Attestation: I performed the procedure.

## 2018-10-03 NOTE — Progress Notes (Signed)
Labor Progress Note  Megan Camacho is a 33 y.o. female  G3 P1101 at 40 wks and 6 days admitted due to post dates with Abnormal Fetal Testing (BPP in the office today 4 out 8). Pregnancy complicated by h/o  cervical insufficiency  And h/o cesarean section due to fetal distress . Pt requst TOLAC. She had a mcdonald cerclage placed 04/10/2018 it was removed at [redacted] wks EGA. Prenatal care provided by Dr. Gerald Leitz with [facility].  Subjective: Pt in NAD in bed. Called to room by RN @ 0145 for variable and lates.  @ 0028 tachysytole noted with variables, pit was at 14 and cut in half to 7, will monitor.  @ 0100 variable  Noted by RN, position changed and fluid bolus given, cat 2, cxt Q2-3 mins, 5/80/-2 @ 0124 variable continued Cat 2 strip, position change again. Cxt Q2-3 with pit of 7 at this time. @ 0148 position change again due to continued variables, Cat 2  @ 0200 6/100/0, AROM, thick mec, reactive to fetal scalp stimulation, strip was recovered until 0208 @ 0208 variable and lates noted again, IUPC placed, tolerated well, blood show present, position change, Cat      2, cxt Q76mins @ 0217 variable noted to nadir of 70s, amnioinfusion started and pitocin discontinued, position change, left      side with peanut ball  @0232  Dr Normand Sloop notified of strip and POC @ 0236 strip had moderate variability, with acels and baseline of 135s.  Patient Active Problem List   Diagnosis Date Noted  . Post-dates pregnancy 10/02/2018  . Cervical incompetence affecting management of mother, antepartum 04/10/2018  . Placenta abruption, delivered, current hospitalization 06/29/2014  . Postpartum care following cesarean delivery (2/22) 06/29/2014  . Indication for care in labor or delivery 06/28/2014   Objective: BP 107/66   Pulse 88   Temp 98.1 F (36.7 C) (Oral)   Resp 20   Ht 5' (1.524 m)   Wt 65.8 kg   LMP 01/02/2018   SpO2 99%   BMI 28.32 kg/m  No intake/output data recorded. Total I/O In: 1079.2  [I.V.:1079.2] Out: 250 [Urine:250] NST: FHR baseline 125 bpm, Variability: moderate, Accelerations:present, Decelerations:  Present lates and variables= Cat 2/Reactive CTX:  irregular, every 2-3 minutes, lasting 60-80 seconds Uterus gravid, soft non tender, moderate to palpate  SVE:  Dilation: 5.5 Effacement (%): 80 Station: -2 Exam by:: K Faucett RN Pitocin at ( 7) mUn/min  AROM, thick mec IUPC placed tolerated well, MVU: 200  Assessment:  Megan Camacho is a 33 y.o. female  G3 P1101 at 40 wks and 6 days admitted due to post dates with Abnormal Fetal Testing (BPP in the office today 4 out 8). Pregnancy complicated by h/o  cervical insufficiency  And h/o cesarean section due to fetal distress . Pt requst TOLAC. She had a mcdonald cerclage placed 04/10/2018 it was removed at [redacted] wks EGA. Prenatal care provided by Dr. Gerald Leitz with [facility]. Pt stable in bed foley bulb out, pt post comfortable with epidural, lates and variables noted with Cat 2, was progressing on pitocin, AROM, mec, IUPC placed, amnioinfusion started.  Patient Active Problem List   Diagnosis Date Noted  . Post-dates pregnancy 10/02/2018  . Cervical incompetence affecting management of mother, antepartum 04/10/2018  . Placenta abruption, delivered, current hospitalization 06/29/2014  . Postpartum care following cesarean delivery (2/22) 06/29/2014  . Indication for care in labor or delivery 06/28/2014   NICHD: Category 2  Membranes:  AROM,  thick mec @ 0200 on 5/28 no s/s of infection  Induction:    Foley Bulb: inserted  @1800  on 5/27, out @ 2300  Pitocin - Was at 14, turned to 7 @ 0028 for tachysytole, then d/c'ed @ 0217  IUPC: MVU 200  Pain management:               IV pain management: x 1dose of 50 mcg @ 1927 on 5/27             Epidural placement:  Placed @ 2315 on 5/27  GBS Negative  Plan: Continue labor plan Continuous monitoring Rest/Frequent position changes to facilitate fetal rotation and descent  with peanut ball. Will reassess with cervical exam at 0600 or earlier if necessary Discontinue pitocin for now, let fetus recover, plan to restart in one hour if cxt space and cat 1 Titrate pitocin to MVU of 200 when restarted Amnioinfusion of 300ml bolus then start 17750ml/hr for variables Plans to sleep Anticipate labor progression and vaginal delivery.   Md Dillard notified.    Dale DurhamJade Karmela Bram, NP-C, CNM, MSN 10/03/2018. 2:43 AM

## 2018-10-04 ENCOUNTER — Encounter (HOSPITAL_COMMUNITY): Payer: Self-pay | Admitting: Obstetrics and Gynecology

## 2018-10-04 LAB — CBC
HCT: 32.2 % — ABNORMAL LOW (ref 36.0–46.0)
Hemoglobin: 10.4 g/dL — ABNORMAL LOW (ref 12.0–15.0)
MCH: 27.3 pg (ref 26.0–34.0)
MCHC: 32.3 g/dL (ref 30.0–36.0)
MCV: 84.5 fL (ref 80.0–100.0)
Platelets: 228 10*3/uL (ref 150–400)
RBC: 3.81 MIL/uL — ABNORMAL LOW (ref 3.87–5.11)
RDW: 17.3 % — ABNORMAL HIGH (ref 11.5–15.5)
WBC: 15.6 10*3/uL — ABNORMAL HIGH (ref 4.0–10.5)
nRBC: 0 % (ref 0.0–0.2)

## 2018-10-04 LAB — BIRTH TISSUE RECOVERY COLLECTION (PLACENTA DONATION)

## 2018-10-04 MED ORDER — IBUPROFEN 600 MG PO TABS: 600.0000 mg | ORAL_TABLET | Freq: Four times a day (QID) | ORAL | Status: DC | PRN

## 2018-10-04 NOTE — Progress Notes (Signed)
Postpartum Note Day # 1  S:  Patient resting comfortable in bed.  She reports that she did not take any pain medication yesterday, but this am is noted some abdominal tenderness.  Tolerating general diet. + flatus, no BM.  Lochia moderate.  Ambulating without difficulty.  She denies n/v/f/c, SOB, or CP.  Pt plans on breastfeeding.  O: Temp:  [98.3 F (36.8 C)-99.8 F (37.7 C)] 99.4 F (37.4 C) (05/29 0544) Pulse Rate:  [73-111] 95 (05/29 0544) Resp:  [15-26] 18 (05/29 0544) BP: (94-197)/(52-173) 97/67 (05/29 0544) SpO2:  [96 %-100 %] 99 % (05/29 0020)   Gen: A&Ox3, NAD CV: RRR Resp: CTAB Abdomen: soft, NT, ND +BS Uterus: firm, non-tender, @ umbilicus Incision: c/d/i, bandage on Ext: No edema, no calf tenderness bilaterally  Labs:  Recent Labs    10/02/18 1351 10/04/18 0602  HGB 11.3* 10.4*    A/P: Pt is a 33 y.o. [id number] s/p repeat C-section, POD#1  - Pain: encouraged pt to take pain medication as needed -GU: voiding freely -GI: Tolerating general diet -Activity: encouraged sitting up to chair and ambulation as tolerated -Prophylaxis: early ambulation -Labs: stable as above -Baby boy circ to be completed  Continue with routine postoperative care, plan for early discharge home tomorrow  Myna Hidalgo, DO [phone] (cell) [phone] (office)

## 2018-10-04 NOTE — Lactation Note (Signed)
This note was copied from a baby's chart. Lactation Consultation Note  Patient Name: Megan Camacho [id number] Date: 10/04/2018 Reason for consult: Initial assessment;Other (Comment)(post term )  Baby is 68 hours old  LC reviewed and updated the doc flow sheets per mom-  Baby awake and hungry and per mom  had just fed both breast and still hungry.  Mom had mentioned her 1st baby had a lip tie and eventually tore on its own.  Breast  Fed and pumped.  LC assessed oral cavity with gloved fingers and noted the upper lip to stretch  Well and the labial tie above the gum line. Good mobility of tongue side to side,  Anterior frenulum noted, no restriction, baby able to raise tongue upward. LC offered to assist / football / left breast/ mom 1st hand expressed with colostrum flow  And LC assisted to latch with better depth. Baby fed for 10 mins and released, nipple well rounded.  Per mom the feeding was comfortable.  Discussed nutritive vs non - nutritive feeding patterns and the importance of watching the baby for hanging  Out latched.  Per mom has DEBP Medela at home and will F/U with Darlin Priestly at [facility] [address] if needed.  LC provided mom with hand outs LC resources for [place] lactation if needed.   Mom and dad receptive to teaching and expressed appreciation for [facility] assistance.      Maternal Data Has patient been taught Hand Expression?: Yes Does the patient have breastfeeding experience prior to this delivery?: Yes  Feeding Feeding Type: Breast Fed  LATCH Score Latch: Grasps breast easily, tongue down, lips flanged, rhythmical sucking.  Audible Swallowing: Spontaneous and intermittent  Type of Nipple: Everted at rest and after stimulation  Comfort (Breast/Nipple): Soft / non-tender  Hold (Positioning): Assistance needed to correctly position infant at breast and maintain latch.  LATCH Score: 9  Interventions Interventions: Breast feeding basics  reviewed;Assisted with latch;Skin to skin;Breast massage;Hand express;Breast compression;Adjust position;Support pillows;Position options;Expressed milk  Lactation Tools Discussed/Used WIC Program: No Pump Review: Milk Storage   Consult Status Consult Status: Follow-up Date: 10/05/18 Follow-up type: In-patient    Matilde Sprang Jillian Pianka 10/04/2018, 11:56 AM

## 2018-10-05 MED ORDER — OXYCODONE HCL 5 MG PO TABS: 5.0000 mg | ORAL_TABLET | ORAL | 0 refills | Status: AC | PRN

## 2018-10-05 MED ORDER — IBUPROFEN 600 MG PO TABS: 600.0000 mg | ORAL_TABLET | Freq: Four times a day (QID) | ORAL | 0 refills | Status: AC | PRN

## 2018-10-05 NOTE — Discharge Summary (Signed)
Failed TOLAC, Repeat CS OB Discharge Summary     Patient Name: Megan Camacho DOB: 03-29-1986 MRN: [id number]  Date of admission: 10/02/2018 Delivering MD: Gerald Leitz  Date of delivery: 10/03/2018 Type of delivery: repeat CS  Newborn Data: Sex: Baby female  Circumcision: performed in pt already Live born female  Birth Weight: 6 lb 11.8 oz (3056 g) APGAR: 7, 9  Newborn Delivery   Birth date/time:  10/03/2018 09:00:00 Delivery type:  C-Section, Low Transverse Trial of labor:  Yes C-section categorization:  Repeat     Feeding: breast Infant being discharge to home with mother in stable condition.   Admitting diagnosis: induction Intrauterine pregnancy: [redacted]w[redacted]d     Secondary diagnosis:  Active Problems:   Post-dates pregnancy   Normal postpartum course   Failed trial of labor following previous cesarean, antepartum                                Complications: None                                                              Intrapartum Procedures: cesarean: low cervical, transverse Postpartum Procedures: none Complications-Operative and Postpartum: none Augmentation: AROM, Pitocin and Foley Balloon   History of Present Illness: Ms. Megan Camacho is a 33 y.o. female, [id number], who presents at [redacted]w[redacted]d weeks gestation. The patient has been followed at  [facility] and Gynecology  Her pregnancy has been complicated by:  Patient Active Problem List   Diagnosis Date Noted  . Normal postpartum course 10/05/2018  . Failed trial of labor following previous cesarean, antepartum 10/05/2018  . Post-dates pregnancy 10/02/2018  . Cervical incompetence affecting management of mother, antepartum 04/10/2018  . Placenta abruption, delivered, current hospitalization 06/29/2014  . Postpartum care following cesarean delivery (2/22) 06/29/2014  . Indication for care in labor or delivery 06/28/2014    Hospital Course--Unscheduled Cesarean: Megan Camacho is a 33 y.o. female  G3  P1101 at 40 wks and 6 days admitted due to post dates with Abnormal Fetal Testing (BPP in the office today 4 out 8).  Pregnancy complicated by h/o  cervical insufficiency  And h/o cesarean section due to fetal distress . Pt requst TOLAC. She had a mcdonald cerclage placed 04/10/2018 it was removed at [redacted] wks EGA. Prenatal care provided by Dr. Gerald Leitz with [facility]. Pt labor and @ 7.5 cm there was fetal intolerance to labor and a repeat CS was performed. Failed TOLAC.  Admitted 10/02/2018. negative GBS.  Utilized epidural for pain management.   Pt had repeat.  Due to fetal intolerence @ 7 cm dilated, she was consented for cesarean, with Dr. Richardson Dopp performing a repeat LTCS under epidural anesthesia, with delivery of a viable baby female, with weight and Apgars as listed below. Infant was in good condition and remained at the patient's bedside.  The patient was taken to recovery in good condition.  Patient planned to breast feed. Circ performed in pt already.  On post-op day 1, patient was doing well, tolerating a regular diet, with Hgb of 10.4.  Throughout her stay, her physical exam was WNL, her incision was CDI, and her vital signs remained stable.  By post-op day 1, she was  up ad lib, tolerating a regular diet, with good pain control with po med.  She was deemed to have received the full benefit of her hospital stay, and was discharged home in stable condition.  Contraceptive choice was undecided.    Physical exam  Vitals:   10/04/18 0544 10/04/18 1353 10/04/18 2227 10/05/18 0600  BP: 97/67 (!) 99/51 103/65 97/64  Pulse: 95 84 86 66  Resp: 18 18 16 16   Temp: 99.4 F (37.4 C) 97.9 F (36.6 C) 98.3 F (36.8 C) 98 F (36.7 C)  TempSrc: Oral Oral Oral Oral  SpO2:  99%  100%  Weight:      Height:       General: alert, cooperative and no distress Lochia: appropriate Uterine Fundus: firm Incision: Healing well with no significant drainage, No significant erythema, Dressing is clean, dry, and intact,  honeycomb dressing CDI Perineum: Intact DVT Evaluation: No evidence of DVT seen on physical exam. Negative Homan's sign. No cords or calf tenderness. No significant calf/ankle edema.  Labs: Lab Results  Component Value Date   WBC 15.6 (H) 10/04/2018   HGB 10.4 (L) 10/04/2018   HCT 32.2 (L) 10/04/2018   MCV 84.5 10/04/2018   PLT 228 10/04/2018   No flowsheet data found.  Date of discharge: 10/05/2018 Discharge Diagnoses: Term Pregnancy-delivered and Failed TOLAC Discharge instruction: per After Visit Summary and "Baby and Me Booklet".  After visit meds:    Activity:           unrestricted and pelvic rest Advance as tolerated. Pelvic rest for 6 weeks.  Diet:                routine Medications: PNV, Ibuprofen, Colace and oxy IR Postpartum contraception: Undecided Condition:  Pt discharge to home with baby in stable  Meds: Allergies as of 10/05/2018   No Known Allergies     Medication List    TAKE these medications   ibuprofen 600 MG tablet Commonly known as:  ADVIL Take 1 tablet (600 mg total) by mouth every 6 (six) hours as needed for mild pain or moderate pain.   oxyCODONE 5 MG immediate release tablet Commonly known as:  Oxy IR/ROXICODONE Take 1 tablet (5 mg total) by mouth every 4 (four) hours as needed for moderate pain.   PRENATAL VITAMINS PO Take 1 tablet by mouth at bedtime. Reported on 11/19/2015            Discharge Care Instructions  (From admission, onward)         Start     Ordered   10/05/18 0000  Discharge wound care:    Comments:  Take dressing off on day 5-7 postpartum.  Report increased drainage, redness or warmth. Clean with water, let soap trickle down body. Can leave steri strips on until they fall off or take them off gently at day 10. Keep open to air, clean and dry.   10/05/18 0919          Discharge Follow Up:  Follow-up Information    Gynecology, [facility] Follow up in 4 week(s).   Specialty:  Obstetrics and  Gynecology Why:  4 weeks PP insicion check 6 week PPV Contact information: [address] [place] [address] [phone]            [facility] Hospital[facility]ade Cypher Paule, NP-C, CNM 10/05/2018, 9:19 AM  Dale DurhamJade Darneisha Windhorst, FNP

## 2018-10-05 NOTE — Lactation Note (Signed)
This note was copied from a baby's chart. Lactation Consultation Note  Patient Name: Megan Camacho [id number] Date: 10/05/2018 Reason for consult: Follow-up assessment   Baby 50 hours old and recently bf for 15 min on R breast. Provided pillows for support.  Mother hand expressed good flow of colostrum. Baby latched easily.  Swallows observed and heard. Feed on demand approximately 8-12 times per day.   Reassured mother. Reviewed engorgement care and monitoring voids/stools. Encouraged mother to compress her breast and keep baby active.    Maternal Data Has patient been taught Hand Expression?: Yes  Feeding Feeding Type: Breast Fed  LATCH Score Latch: Grasps breast easily, tongue down, lips flanged, rhythmical sucking.  Audible Swallowing: Spontaneous and intermittent  Type of Nipple: Everted at rest and after stimulation  Comfort (Breast/Nipple): Soft / non-tender  Hold (Positioning): Assistance needed to correctly position infant at breast and maintain latch.  LATCH Score: 9  Interventions Interventions: Breast feeding basics reviewed;Hand express  Lactation Tools Discussed/Used     Consult Status Consult Status: Complete Date: 10/05/18    Dahlia Byes [facility] 10/05/2018, 11:58 AM

## 2018-10-06 ENCOUNTER — Other Ambulatory Visit: Payer: Self-pay

## 2018-10-06 ENCOUNTER — Encounter (HOSPITAL_COMMUNITY): Payer: Self-pay

## 2018-10-06 ENCOUNTER — Inpatient Hospital Stay (HOSPITAL_COMMUNITY)
Admission: AD | Admit: 2018-10-06 | Discharge: 2018-10-06 | Disposition: A | Discharge status: Home / Self Care | Payer: PPO | Attending: Obstetrics and Gynecology | Admitting: Obstetrics and Gynecology

## 2018-10-06 DIAGNOSIS — Z98891 History of uterine scar from previous surgery: Secondary | ICD-10-CM | POA: Insufficient documentation

## 2018-10-06 DIAGNOSIS — R600 Localized edema: Secondary | ICD-10-CM | POA: Diagnosis not present

## 2018-10-06 DIAGNOSIS — O9989 Other specified diseases and conditions complicating pregnancy, childbirth and the puerperium: Secondary | ICD-10-CM | POA: Insufficient documentation

## 2018-10-06 LAB — URINALYSIS, ROUTINE W REFLEX MICROSCOPIC
Bilirubin Urine: NEGATIVE
Glucose, UA: NEGATIVE mg/dL
Ketones, ur: NEGATIVE mg/dL
Nitrite: NEGATIVE
Protein, ur: NEGATIVE mg/dL
Specific Gravity, Urine: 1.009 (ref 1.005–1.030)
pH: 7 (ref 5.0–8.0)

## 2018-10-06 NOTE — MAU Note (Signed)
Pt reports she delivered via c section 4 days ago.  Pt c/o swelling in her lower extremities since yesterday.  Pt denies any pain at this time.

## 2018-10-06 NOTE — Discharge Instructions (Signed)

## 2018-10-06 NOTE — MAU Provider Note (Signed)
Medically screen pt. Here for for bilat leg swelling. Requesting to go off unit to nurse baby. VSS. Pt in NAD. Stable to go nurse baby.  Chief Complaint: Leg Swelling   First Provider Initiated Contact with Patient 10/06/18 1832      SUBJECTIVE HPI: Megan Camacho is a 33 y.o. G3P2102 at 3 days status post repeat C-section who presents to Maternity Admissions reporting bilat swelling of legs since yesterday.  No history of hypertension.  Denies calf pain, shortness of breath, headache, vision changes or epigastric pain.  Reports having a high sodium soup yesterday and feels like the swelling worsened afterward.  Past Medical History:  Diagnosis Date  . Spinal headache    OB History  Gravida Para Term Preterm AB Living  3 3 2 1   2   SAB TAB Ectopic Multiple Live Births        0 2    # Outcome Date GA Lbr Len/2nd Weight Sex Delivery Anes PTL Lv  3 Term 10/03/18 [redacted]w[redacted]d  3056 g M CS-LTranv EPI  LIV  2 Term 06/29/14 [redacted]w[redacted]d  3015 g M CS-LTranv EPI  LIV  1 Preterm  [redacted]w[redacted]d       FD   Past Surgical History:  Procedure Laterality Date  . CERVICAL CERCLAGE    . CERVICAL CERCLAGE N/A 04/10/2018   Procedure: CERCLAGE CERVICAL;  Surgeon: Gerald Leitz, MD;  Location: [facility];  Service: Gynecology;  Laterality: N/A;  . CESAREAN SECTION N/A 06/29/2014   Procedure: CESAREAN SECTION;  Surgeon: Robley Fries, MD;  Location: [facility] ORS;  Service: Obstetrics;  Laterality: N/A;  . CESAREAN SECTION N/A 10/03/2018   Procedure: CESAREAN SECTION;  Surgeon: Gerald Leitz, MD;  Location: [facility] LD ORS;  Service: Obstetrics;  Laterality: N/A;  . WISDOM TOOTH EXTRACTION     Social History   Socioeconomic History  . Marital status: Married    Spouse name: Not on file  . Number of children: Not on file  . Years of education: Not on file  . Highest education level: Not on file  Occupational History  . Not on file  Social Needs  . Financial resource strain: Not hard at all  . Food insecurity:    Worry: Never  true    Inability: Never true  . Transportation needs:    Medical: No    Non-medical: Not on file  Tobacco Use  . Smoking status: Never Smoker  . Smokeless tobacco: Never Used  Substance and Sexual Activity  . Alcohol use: No    Alcohol/week: 0.0 standard drinks  . Drug use: No  . Sexual activity: Not Currently    Birth control/protection: None  Lifestyle  . Physical activity:    Days per week: Not on file    Minutes per session: Not on file  . Stress: Not at all  Relationships  . Social connections:    Talks on phone: Not on file    Gets together: Not on file    Attends religious service: Not on file    Active member of club or organization: Not on file    Attends meetings of clubs or organizations: Not on file    Relationship status: Not on file  . Intimate partner violence:    Fear of current or ex partner: No    Emotionally abused: No    Physically abused: No    Forced sexual activity: No  Other Topics Concern  . Not on file  Social History Narrative  .  Not on file   Family History  Problem Relation Age of Onset  . Diabetes Maternal Grandmother    No current facility-administered medications on file prior to encounter.    Current Outpatient Medications on File Prior to Encounter  Medication Sig Dispense Refill  . ibuprofen (ADVIL) 600 MG tablet Take 1 tablet (600 mg total) by mouth every 6 (six) hours as needed for mild pain or moderate pain. 30 tablet 0  . oxyCODONE (OXY IR/ROXICODONE) 5 MG immediate release tablet Take 1 tablet (5 mg total) by mouth every 4 (four) hours as needed for moderate pain. 20 tablet 0  . Prenatal Multivit-Min-Fe-FA (PRENATAL VITAMINS PO) Take 1 tablet by mouth at bedtime. Reported on 11/19/2015     No Known Allergies  I have reviewed patient's Past Medical Hx, Surgical Hx, Family Hx, Social Hx, medications and allergies.   Review of Systems  Constitutional: Negative for fever.  Eyes: Negative for visual disturbance.   Cardiovascular: Positive for leg swelling. Negative for chest pain and palpitations.  Gastrointestinal: Negative for abdominal pain.  Musculoskeletal:       Negative for calf pain  Neurological: Negative for headaches.    OBJECTIVE Patient Vitals for the past 24 hrs:  BP Temp Temp src Pulse Resp SpO2 Height Weight  10/06/18 1854 109/69 - - 89 - - - -  10/06/18 1714 115/64 98.1 F (36.7 C) Oral - 16 99 % 5' (1.524 m) 65.8 kg   Constitutional: Well-developed, well-nourished female in no acute distress.  Cardiovascular: normal rate Respiratory: normal rate and effort.  GI: Deferred MS: Extremities nontender, 2+ pedal edema, left slightly greater than right (0.5 cm greater Circumference), normal ROM.  Negative Homans sign, warmth or cords. Neurologic: Alert and oriented x 4.  GU: Deferred  LAB RESULTS Results for orders placed or performed during the hospital encounter of 10/06/18 (from the past 24 hour(s))  Urinalysis, Routine w reflex microscopic     Status: Abnormal   Collection Time: 10/06/18  5:46 PM  Result Value Ref Range   Color, Urine YELLOW YELLOW   APPearance HAZY (A) CLEAR   Specific Gravity, Urine 1.009 1.005 - 1.030   pH 7.0 5.0 - 8.0   Glucose, UA NEGATIVE NEGATIVE mg/dL   Hgb urine dipstick MODERATE (A) NEGATIVE   Bilirubin Urine NEGATIVE NEGATIVE   Ketones, ur NEGATIVE NEGATIVE mg/dL   Protein, ur NEGATIVE NEGATIVE mg/dL   Nitrite NEGATIVE NEGATIVE   Leukocytes,Ua SMALL (A) NEGATIVE   RBC / HPF 0-5 0 - 5 RBC/hpf   WBC, UA 11-20 0 - 5 WBC/hpf   Bacteria, UA RARE (A) NONE SEEN   Squamous Epithelial / LPF 11-20 0 - 5   Mucus PRESENT     IMAGING No results found.  MAU COURSE Orders Placed This Encounter  Procedures  . Urinalysis, Routine w reflex microscopic  . Discharge patient   No orders of the defined types were placed in this encounter.   MDM - Postpartum/postop pedal edema combination of normal fluid shifts after delivery exacerbated by high  sodium diet yesterday.  No evidence of DVT or blood pressure problems.   ASSESSMENT 1. Pedal edema   2. Postpartum state   3. Status post cesarean delivery     PLAN Discharge home in stable condition. DVT precautions Follow-up Information    [facility] Follow up in 2 week(s).   Specialty:  Obstetrics and Gynecology Why:  as scheduled or sooner as needed if symptoms worsen Contact information:  [address] [place] [phone]       [facility] Follow up.   Specialty:  Obstetrics and Gynecology Why:  as needed in postpartum emergencies Contact information: [address] [id number] [place] [phone]         Allergies as of 10/06/2018   No Known Allergies     Medication List    TAKE these medications   ibuprofen 600 MG tablet Commonly known as:  ADVIL Take 1 tablet (600 mg total) by mouth every 6 (six) hours as needed for mild pain or moderate pain.   oxyCODONE 5 MG immediate release tablet Commonly known as:  Oxy IR/ROXICODONE Take 1 tablet (5 mg total) by mouth every 4 (four) hours as needed for moderate pain.   PRENATAL VITAMINS PO Take 1 tablet by mouth at bedtime. Reported on 11/19/2015        Katrinka BlazingSmith, [place], CNM 10/06/2018  7:21 PM

## 2018-10-09 ENCOUNTER — Inpatient Hospital Stay (HOSPITAL_COMMUNITY): Payer: PPO

## 2019-10-24 IMAGING — DX PORTABLE CHEST - 1 VIEW
1 series · 1 of 1 positions shown · non-contrast
Comparison: None.

CLINICAL DATA: Cough.

EXAM:
PORTABLE CHEST 1 VIEW

[chest ap]
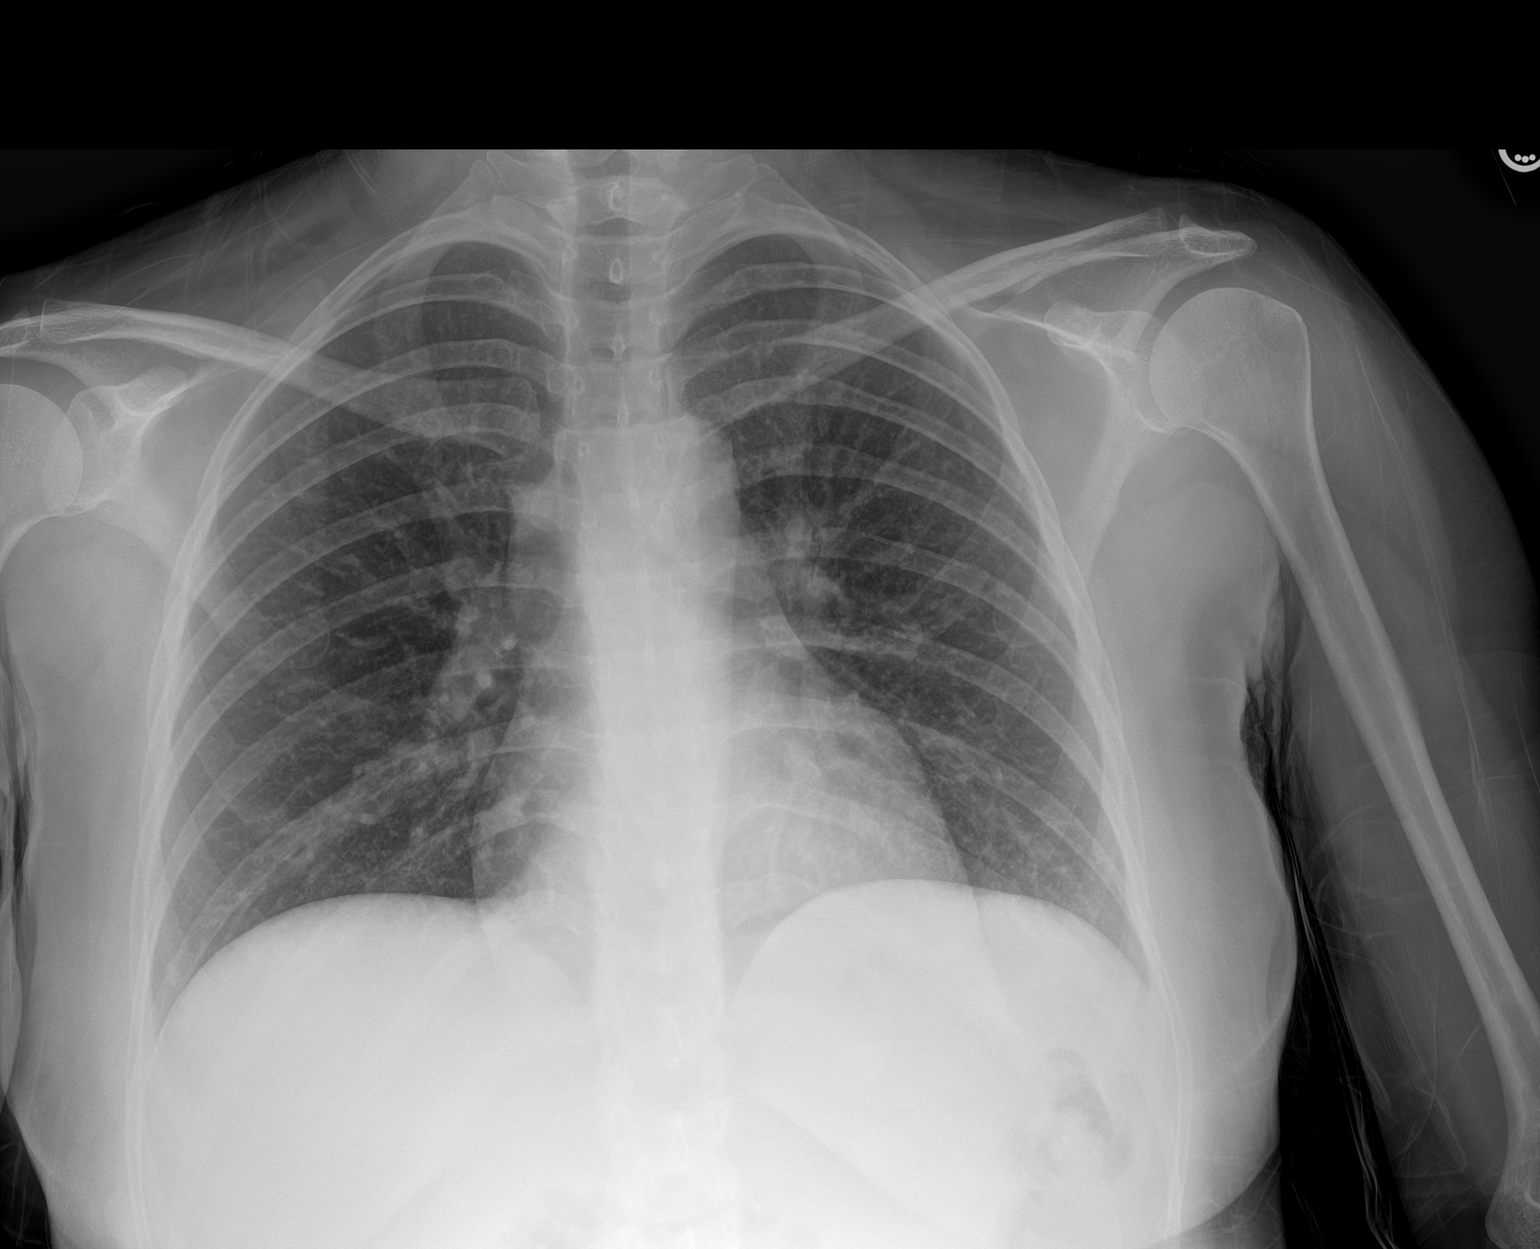

[1 of 1 positions shown; findings below may reference images not displayed]

FINDINGS: The heart size is normal. Lung volumes are low. No focal airspace
disease is present. There is no edema or effusion. The visualized
soft tissues and bony thorax are unremarkable.
IMPRESSION: 1. Low lung volumes.
2. No acute cardiopulmonary disease.
